# Patient Record
Sex: Female | Born: 1935 | Hispanic: No | Smoking: Never smoker
Health system: Southern US, Community
[De-identification: ages and names within clinical notes are randomized; demographics above are authoritative.]

## PROBLEM LIST (undated history)

## (undated) DIAGNOSIS — R269 Unspecified abnormalities of gait and mobility: Secondary | ICD-10-CM

## (undated) DIAGNOSIS — E785 Hyperlipidemia, unspecified: Secondary | ICD-10-CM

## (undated) DIAGNOSIS — E119 Type 2 diabetes mellitus without complications: Secondary | ICD-10-CM

## (undated) DIAGNOSIS — E079 Disorder of thyroid, unspecified: Secondary | ICD-10-CM

## (undated) DIAGNOSIS — M1990 Unspecified osteoarthritis, unspecified site: Secondary | ICD-10-CM

## (undated) DIAGNOSIS — F0280 Dementia in other diseases classified elsewhere without behavioral disturbance: Secondary | ICD-10-CM

## (undated) DIAGNOSIS — F329 Major depressive disorder, single episode, unspecified: Secondary | ICD-10-CM

## (undated) DIAGNOSIS — G309 Alzheimer's disease, unspecified: Secondary | ICD-10-CM

## (undated) DIAGNOSIS — F32A Depression, unspecified: Secondary | ICD-10-CM

## (undated) HISTORY — PX: CHOLECYSTECTOMY: SHX55

---

## 2000-05-09 ENCOUNTER — Encounter: Admission: RE | Admit: 2000-05-09 | Discharge: 2000-05-09 | Payer: Self-pay | Admitting: *Deleted

## 2000-07-19 ENCOUNTER — Other Ambulatory Visit: Admission: RE | Admit: 2000-07-19 | Discharge: 2000-07-19 | Payer: Self-pay | Admitting: *Deleted

## 2000-09-15 ENCOUNTER — Ambulatory Visit (HOSPITAL_COMMUNITY): Admission: RE | Admit: 2000-09-15 | Discharge: 2000-09-15 | Payer: Self-pay | Admitting: *Deleted

## 2001-01-23 ENCOUNTER — Encounter: Admission: RE | Admit: 2001-01-23 | Discharge: 2001-01-23 | Payer: Self-pay | Admitting: *Deleted

## 2001-05-11 ENCOUNTER — Encounter: Admission: RE | Admit: 2001-05-11 | Discharge: 2001-05-11 | Payer: Self-pay | Admitting: *Deleted

## 2002-05-15 ENCOUNTER — Encounter: Payer: Self-pay | Admitting: Internal Medicine

## 2002-05-15 ENCOUNTER — Encounter: Admission: RE | Admit: 2002-05-15 | Discharge: 2002-05-15 | Payer: Self-pay | Admitting: Internal Medicine

## 2003-05-22 ENCOUNTER — Encounter: Payer: Self-pay | Admitting: Internal Medicine

## 2003-05-22 ENCOUNTER — Encounter: Admission: RE | Admit: 2003-05-22 | Discharge: 2003-05-22 | Payer: Self-pay | Admitting: Internal Medicine

## 2004-06-09 ENCOUNTER — Encounter: Admission: RE | Admit: 2004-06-09 | Discharge: 2004-06-09 | Payer: Self-pay | Admitting: Internal Medicine

## 2005-06-17 ENCOUNTER — Encounter: Admission: RE | Admit: 2005-06-17 | Discharge: 2005-06-17 | Payer: Self-pay | Admitting: Internal Medicine

## 2006-06-20 ENCOUNTER — Encounter: Admission: RE | Admit: 2006-06-20 | Discharge: 2006-06-20 | Payer: Self-pay | Admitting: Internal Medicine

## 2007-06-25 ENCOUNTER — Emergency Department (HOSPITAL_COMMUNITY): Admission: EM | Admit: 2007-06-25 | Discharge: 2007-06-25 | Payer: Self-pay | Admitting: Emergency Medicine

## 2011-02-26 NOTE — Op Note (Signed)
[place]. [facility]  Patient:    Cassidy Paul, Cassidy Paul                        MRN: [id number] Proc. Date: 09/15/00 Adm. Date:  [id number] Attending:  Sabino Gasser                           Operative Report  PROCEDURE:  Colonoscopy.  INDICATIONS:  Cancer screening.  ANESTHESIA:  Demerol 40 mg, Versed 4 mg.  DESCRIPTION OF PROCEDURE:  With the patient mildly sedated in the left lateral decubitus position, the Olympus video colonoscope was inserted in the rectum and passed under direct vision to the cecum.  The cecum identified by ileocecal valve and appendiceal orifice both of which were photographed.  From this point the colonoscope was slowly withdrawn taking circumferential views of the entire colonic mucosa stopping in the rectum which appeared normal on direct and showed internal hemorrhoids on retroflexed view.  The endoscope was straightened and withdrawn.  The patients vital signs and pulse oximeter remained stable.  The patient tolerated the procedure well with no apparent complications.  FINDINGS:  Internal hemorrhoids, otherwise unremarkable colonoscopic examination to the cecum.  PLAN:  Repeat examination in approximately 5-10 years. DD:  09/15/00 TD:  09/15/00 Job: [id number]

## 2011-07-23 LAB — URINALYSIS, ROUTINE W REFLEX MICROSCOPIC
Bilirubin Urine: NEGATIVE
Glucose, UA: NEGATIVE
Hgb urine dipstick: NEGATIVE
Ketones, ur: NEGATIVE
Nitrite: NEGATIVE
Protein, ur: NEGATIVE
Specific Gravity, Urine: 1.021
Urobilinogen, UA: 0.2
pH: 5.5

## 2012-08-13 ENCOUNTER — Encounter (HOSPITAL_COMMUNITY): Payer: Self-pay | Admitting: Emergency Medicine

## 2012-08-13 ENCOUNTER — Emergency Department (HOSPITAL_COMMUNITY)
Admission: EM | Admit: 2012-08-13 | Discharge: 2012-08-13 | Disposition: A | Discharge status: Home / Self Care | Payer: Medicare Other | Attending: Emergency Medicine | Admitting: Emergency Medicine

## 2012-08-13 ENCOUNTER — Emergency Department (HOSPITAL_COMMUNITY): Payer: Medicare Other

## 2012-08-13 DIAGNOSIS — M129 Arthropathy, unspecified: Secondary | ICD-10-CM | POA: Insufficient documentation

## 2012-08-13 DIAGNOSIS — E119 Type 2 diabetes mellitus without complications: Secondary | ICD-10-CM | POA: Insufficient documentation

## 2012-08-13 DIAGNOSIS — IMO0002 Reserved for concepts with insufficient information to code with codable children: Secondary | ICD-10-CM | POA: Insufficient documentation

## 2012-08-13 DIAGNOSIS — E079 Disorder of thyroid, unspecified: Secondary | ICD-10-CM | POA: Insufficient documentation

## 2012-08-13 DIAGNOSIS — Z23 Encounter for immunization: Secondary | ICD-10-CM | POA: Insufficient documentation

## 2012-08-13 DIAGNOSIS — M545 Low back pain, unspecified: Secondary | ICD-10-CM | POA: Insufficient documentation

## 2012-08-13 DIAGNOSIS — Y9210 Unspecified residential institution as the place of occurrence of the external cause: Secondary | ICD-10-CM | POA: Insufficient documentation

## 2012-08-13 DIAGNOSIS — Y9389 Activity, other specified: Secondary | ICD-10-CM | POA: Insufficient documentation

## 2012-08-13 DIAGNOSIS — S50819A Abrasion of unspecified forearm, initial encounter: Secondary | ICD-10-CM

## 2012-08-13 DIAGNOSIS — W010XXA Fall on same level from slipping, tripping and stumbling without subsequent striking against object, initial encounter: Secondary | ICD-10-CM | POA: Insufficient documentation

## 2012-08-13 DIAGNOSIS — W19XXXA Unspecified fall, initial encounter: Secondary | ICD-10-CM

## 2012-08-13 DIAGNOSIS — E785 Hyperlipidemia, unspecified: Secondary | ICD-10-CM | POA: Insufficient documentation

## 2012-08-13 HISTORY — DX: Hyperlipidemia, unspecified: E78.5

## 2012-08-13 HISTORY — DX: Unspecified osteoarthritis, unspecified site: M19.90

## 2012-08-13 HISTORY — DX: Disorder of thyroid, unspecified: E07.9

## 2012-08-13 HISTORY — DX: Type 2 diabetes mellitus without complications: E11.9

## 2012-08-13 MED ORDER — TETANUS-DIPHTH-ACELL PERTUSSIS 5-2.5-18.5 LF-MCG/0.5 IM SUSP
0.5000 mL | Freq: Once | INTRAMUSCULAR | Status: AC
  Administered 2012-08-13: 0.5 mL via INTRAMUSCULAR
  Filled 2012-08-13: qty 0.5

## 2012-08-13 MED ORDER — ACETAMINOPHEN 325 MG PO TABS
650.0000 mg | ORAL_TABLET | Freq: Once | ORAL | Status: AC
  Administered 2012-08-13: 650 mg via ORAL
  Filled 2012-08-13: qty 2

## 2012-08-13 NOTE — ED Notes (Signed)
Pt states that she really doesn't remember what happened.

## 2012-08-13 NOTE — ED Provider Notes (Signed)
History     CSN: [id number]  Arrival date & time 08/13/12  0600   First MD Initiated Contact with Patient 08/13/12 0701      Chief Complaint  Patient presents with  . Fall    (Consider location/radiation/quality/duration/timing/severity/associated sxs/prior treatment) Patient is a 78 y.o. female presenting with fall. The history is provided by the patient and a relative.  Fall Pertinent negatives include no fever, no abdominal pain and no headaches.  s/p fall at [facility] this morning. Pt normally walks w walker. Had tried to go to bathroom without her walker. Tripped and fell. Denies loc. Denies preceding faintness or lightheadedness. Was able to call for staff who found pt awake, alert, mental status c/w baseline. Pt c/o low back pain. Constant. Dull. Non radiating. No numbness/weakness. No radicular pain. Also w superficial abrasion to left forearm. Tetanus unknown. No neck pain. Denies headache. No nv. No cp or sob. No abd pain. No nvd. No gu c/o. Denies other pain or injury.   Past Medical History  Diagnosis Date  . Arthritis   . Thyroid disease   . Hyperlipemia   . Diabetes mellitus without complication     Past Surgical History  Procedure Date  . Cholecystectomy     No family history on file.  History  Substance Use Topics  . Smoking status: Never Smoker   . Smokeless tobacco: Never Used  . Alcohol Use: No    OB History    Grav Para Term Preterm Abortions TAB SAB Ect Mult Living                  Review of Systems  Constitutional: Negative for fever and chills.  HENT: Negative for neck pain.   Eyes: Negative for redness.  Respiratory: Negative for shortness of breath.   Cardiovascular: Negative for chest pain.  Gastrointestinal: Negative for abdominal pain.  Genitourinary: Negative for flank pain.  Musculoskeletal: Positive for back pain.  Skin: Negative for rash.  Neurological: Negative for headaches.  Hematological: Does not bruise/bleed easily.    Psychiatric/Behavioral: Negative for confusion.    Allergies  Review of patient's allergies indicates no known allergies.  Home Medications   Current Outpatient Rx  Name  Route  Sig  Dispense  Refill  . ACETAMINOPHEN ER 650 MG PO TBCR   Oral   Take 650 mg by mouth every 8 (eight) hours as needed. ARTHRITIS PAIN         . VITAMIN D 1000 UNITS PO TABS   Oral   Take 1,000 Units by mouth daily.         . DONEPEZIL HCL 10 MG PO TABS   Oral   Take 10 mg by mouth at bedtime.         . FUROSEMIDE 40 MG PO TABS   Oral   Take 40 mg by mouth 2 (two) times daily.         Marland Kitchen LEVOTHYROXINE SODIUM 50 MCG PO TABS   Oral   Take 50 mcg by mouth daily.         Marland Kitchen PIOGLITAZONE HCL-METFORMIN HCL 15-500 MG PO TABS   Oral   Take 1 tablet by mouth 2 (two) times daily with a meal.         . POTASSIUM CHLORIDE ER 10 MEQ PO TBCR   Oral   Take 20 mEq by mouth daily.         Marland Kitchen SIMVASTATIN 40 MG PO TABS   Oral   Take  40 mg by mouth at bedtime.           BP 125/51  Pulse 75  Temp 98.6 F (37 C) (Oral)  Resp 18  SpO2 91%  Physical Exam  Nursing note and vitals reviewed. Constitutional: She appears well-developed and well-nourished. No distress.  HENT:  Head: Atraumatic.  Mouth/Throat: Oropharynx is clear and moist.  Eyes: Conjunctivae normal are normal. Pupils are equal, round, and reactive to light. No scleral icterus.  Neck: Neck supple. No tracheal deviation present.       No bruit  Cardiovascular: Normal rate, regular rhythm, normal heart sounds and intact distal pulses.   Pulmonary/Chest: Effort normal and breath sounds normal. No respiratory distress.  Abdominal: Soft. Normal appearance and bowel sounds are normal. She exhibits no distension. There is no tenderness.  Musculoskeletal: She exhibits no edema.       Small superficial abrasion to left forearm, no bony tenderness to area. Upper lumbar tenderness, otherwise CTLS spine, non tender, aligned, no step  off. Good rom bil extremities without pain or focal bony tenderness.   Neurological: She is alert.       Alert, speech clear/fluent. Answers appropriately. Mental status c/w baseline per family. Motor intact bil.   Skin: Skin is warm and dry. No rash noted.  Psychiatric: She has a normal mood and affect.    ED Course  Procedures (including critical care time)  Dg Lumbar Spine Complete  08/13/2012  *RADIOLOGY REPORT*  Clinical Data: History of fall complaining of back pain.  LUMBAR SPINE - COMPLETE 4+ VIEW  Comparison: Lumbar spine radiograph 06/25/2007.  Findings: Multiple views of the lumbar spine demonstrate no acute displaced fractures or definite compression type fractures. Alignment is anatomic.  Multilevel degenerative disc disease in lumbar facet arthropathy is noted, most severe at L5-S1. Atherosclerosis.  IMPRESSION: 1.  No acute radiographic abnormality of the lumbar spine. 2.  Mild multilevel degenerative disc disease and lumbar spondylosis. 3.  Atherosclerosis.   Original Report Authenticated By: Trudie Reed, M.D.       MDM  Xray. Tylenol po. Abrasion cleaned/sterile dressing. Tetanus im.   Reviewed nursing notes and prior charts for additional history.   Recheck pt comfortable.         Suzi Roots, MD 08/13/12 [phone]

## 2012-08-13 NOTE — ED Notes (Signed)
Bed:WA19<BR> Expected date:<BR> Expected time:<BR> Means of arrival:<BR> Comments:<BR> EMS

## 2012-08-13 NOTE — ED Notes (Addendum)
Per EMS, pt was at [facility] when she stumbled and fell to the ground. Pt hit her head but does not have any pain. Only c/o lower back tenderness. No LOC, found immediately by staff. Pt not on LSB and no c-collar in place. Pt moves all extremities well.

## 2013-06-18 ENCOUNTER — Emergency Department (HOSPITAL_COMMUNITY)
Admission: EM | Admit: 2013-06-18 | Discharge: 2013-06-18 | Disposition: A | Discharge status: Home / Self Care | Payer: Medicare Other | Attending: Emergency Medicine | Admitting: Emergency Medicine

## 2013-06-18 ENCOUNTER — Encounter (HOSPITAL_COMMUNITY): Payer: Self-pay | Admitting: Emergency Medicine

## 2013-06-18 ENCOUNTER — Emergency Department (HOSPITAL_COMMUNITY): Payer: Medicare Other

## 2013-06-18 DIAGNOSIS — Z79899 Other long term (current) drug therapy: Secondary | ICD-10-CM | POA: Insufficient documentation

## 2013-06-18 DIAGNOSIS — Y9301 Activity, walking, marching and hiking: Secondary | ICD-10-CM | POA: Insufficient documentation

## 2013-06-18 DIAGNOSIS — M129 Arthropathy, unspecified: Secondary | ICD-10-CM | POA: Insufficient documentation

## 2013-06-18 DIAGNOSIS — M549 Dorsalgia, unspecified: Secondary | ICD-10-CM

## 2013-06-18 DIAGNOSIS — E785 Hyperlipidemia, unspecified: Secondary | ICD-10-CM | POA: Insufficient documentation

## 2013-06-18 DIAGNOSIS — W010XXA Fall on same level from slipping, tripping and stumbling without subsequent striking against object, initial encounter: Secondary | ICD-10-CM | POA: Insufficient documentation

## 2013-06-18 DIAGNOSIS — IMO0002 Reserved for concepts with insufficient information to code with codable children: Secondary | ICD-10-CM | POA: Insufficient documentation

## 2013-06-18 DIAGNOSIS — Y9210 Unspecified residential institution as the place of occurrence of the external cause: Secondary | ICD-10-CM | POA: Insufficient documentation

## 2013-06-18 DIAGNOSIS — E079 Disorder of thyroid, unspecified: Secondary | ICD-10-CM | POA: Insufficient documentation

## 2013-06-18 DIAGNOSIS — E119 Type 2 diabetes mellitus without complications: Secondary | ICD-10-CM | POA: Insufficient documentation

## 2013-06-18 NOTE — ED Notes (Signed)
CBG 140  

## 2013-06-18 NOTE — ED Notes (Signed)
[facility] called  

## 2013-06-18 NOTE — ED Notes (Signed)
Bed: WA10 Expected date:  Expected time:  Means of arrival:  Comments: EMS Fall 

## 2013-06-18 NOTE — ED Notes (Signed)
Per EMS-pt from [facility] with c/o of fall this am. Pain to lower back. Staff states that pt hit head. Pt denies pain to head. Hx of Alzheimer's.

## 2013-06-18 NOTE — ED Provider Notes (Signed)
CSN: [id number]     Arrival date & time 06/18/13  0847 History   First MD Initiated Contact with Patient 06/18/13 [id number]     Chief Complaint  Patient presents with  . Fall    HPI  Is a very pleasant 79 year old female from [place], a care facility. She fell this morning. She was walking to breakfast. She states she caught her toe. She lost her balance. She caught herself against the wall with her hands but then fell backwards. She states she may have bumped her head. She hasn't had a headache or any pain. She has some pain in her back. She was able to get herself up and ambulate. However it was witnessed by staff and she was referred here for evaluation.. No headache. No neck pain. Additionally symptoms. She is quite clear that this was a fall had no prodromal symptoms with dizziness or palpitations or pain.. She states "they will make a federal case out of it, I just want to go back and have breakfast" . Past Medical History  Diagnosis Date  . Arthritis   . Thyroid disease   . Hyperlipemia   . Diabetes mellitus without complication    Past Surgical History  Procedure Laterality Date  . Cholecystectomy     History reviewed. No pertinent family history. History  Substance Use Topics  . Smoking status: Never Smoker   . Smokeless tobacco: Never Used  . Alcohol Use: No   OB History   Grav Para Term Preterm Abortions TAB SAB Ect Mult Living                 Review of Systems  Constitutional: Negative for fever, chills, diaphoresis, appetite change and fatigue.  HENT: Negative for sore throat, mouth sores and trouble swallowing.   Eyes: Negative for visual disturbance.  Respiratory: Negative for cough, chest tightness, shortness of breath and wheezing.   Cardiovascular: Negative for chest pain.  Gastrointestinal: Negative for nausea, vomiting, abdominal pain, diarrhea and abdominal distention.  Endocrine: Negative for polydipsia, polyphagia and polyuria.  Genitourinary: Negative for  dysuria, frequency and hematuria.  Musculoskeletal: Positive for back pain. Negative for gait problem.  Skin: Negative for color change, pallor and rash.  Neurological: Negative for dizziness, syncope, weakness, light-headedness, numbness and headaches.  Hematological: Does not bruise/bleed easily.  Psychiatric/Behavioral: Negative for behavioral problems and confusion.    Allergies  Review of patient's allergies indicates no known allergies.  Home Medications   Current Outpatient Rx  Name  Route  Sig  Dispense  Refill  . acetaminophen (TYLENOL) 650 MG CR tablet   Oral   Take 650 mg by mouth every 8 (eight) hours as needed for pain.          . cholecalciferol (VITAMIN D) 1000 UNITS tablet   Oral   Take 1,000 Units by mouth daily.         Marland Kitchen donepezil (ARICEPT) 10 MG tablet   Oral   Take 10 mg by mouth at bedtime.         . furosemide (LASIX) 40 MG tablet   Oral   Take 40 mg by mouth 2 (two) times daily.         Marland Kitchen glipiZIDE (GLUCOTROL) 5 MG tablet   Oral   Take 5 mg by mouth 2 (two) times daily before a meal.         . levothyroxine (SYNTHROID, LEVOTHROID) 50 MCG tablet   Oral   Take 50 mcg by mouth daily.         Marland Kitchen  potassium chloride (K-DUR) 10 MEQ tablet   Oral   Take 20 mEq by mouth daily.         . simvastatin (ZOCOR) 40 MG tablet   Oral   Take 40 mg by mouth at bedtime.          BP 174/45  Pulse 76  Temp(Src) 98.8 F (37.1 C) (Oral)  Resp 18  SpO2 97% Physical Exam  Constitutional:  This is awake alert oriented lucid 79 year old female.  HENT:  Senna Combivent. Nontender over the scalp. No erythema or soft tissue swelling over the scalp  Musculoskeletal:  Physical exam her back she has some discomfort diffusely in the paraspinal musculature lumbar spine. Nontender over the iliac crest and the trochanters or the hips. Normal range of motion lower extremities without pain.  Neurological:  Awake alert. Oriented lucid. The recent  extremities. Normal cranial nerves.    ED Course  Procedures (including critical care time) Labs Review Labs Reviewed - No data to display Imaging Review Dg Lumbar Spine Complete  06/18/2013   *RADIOLOGY REPORT*  Clinical Data: Recent injury with low back pain  LUMBAR SPINE - COMPLETE 4+ VIEW  Comparison: 08/13/2012  Findings: Five lumbar type vertebral bodies are well visualized. Facet hypertrophic changes are seen.  No spondylolysis or spondylolisthesis is noted.  A compression deformity of L1 is seen which is new from prior exam but appears chronic in nature with increased sclerosis. No other focal abnormality is noted.  IMPRESSION: Compression deformity of L1 which appears chronic in nature.  It is of uncertain age however.  An MRI or bone scan would be helpful to assess for the acuteness of this fracture.   Original Report Authenticated By: Alcide Clever, M.D.    MDM   1. Back pain    X-ray showed old stable appearing compression fracture. No acute abnormalities noted. She is mandatory the department. Plan is to her care facility her diagnosis is back pain after fall, without acute neurologic abnormality.    Claudean Kinds, MD 06/18/13 1046

## 2013-07-27 ENCOUNTER — Emergency Department (HOSPITAL_COMMUNITY): Payer: Medicare Other

## 2013-07-27 ENCOUNTER — Encounter (HOSPITAL_COMMUNITY): Payer: Self-pay | Admitting: Emergency Medicine

## 2013-07-27 ENCOUNTER — Emergency Department (HOSPITAL_COMMUNITY)
Admission: EM | Admit: 2013-07-27 | Discharge: 2013-07-27 | Disposition: A | Discharge status: Nursing Home (Includes ICF) | Payer: Medicare Other | Attending: Emergency Medicine | Admitting: Emergency Medicine

## 2013-07-27 DIAGNOSIS — W19XXXA Unspecified fall, initial encounter: Secondary | ICD-10-CM

## 2013-07-27 DIAGNOSIS — Z043 Encounter for examination and observation following other accident: Secondary | ICD-10-CM | POA: Insufficient documentation

## 2013-07-27 DIAGNOSIS — Z79899 Other long term (current) drug therapy: Secondary | ICD-10-CM | POA: Insufficient documentation

## 2013-07-27 DIAGNOSIS — M129 Arthropathy, unspecified: Secondary | ICD-10-CM | POA: Insufficient documentation

## 2013-07-27 DIAGNOSIS — F0280 Dementia in other diseases classified elsewhere without behavioral disturbance: Secondary | ICD-10-CM | POA: Insufficient documentation

## 2013-07-27 DIAGNOSIS — E785 Hyperlipidemia, unspecified: Secondary | ICD-10-CM | POA: Insufficient documentation

## 2013-07-27 DIAGNOSIS — Y9389 Activity, other specified: Secondary | ICD-10-CM | POA: Insufficient documentation

## 2013-07-27 DIAGNOSIS — Y9210 Unspecified residential institution as the place of occurrence of the external cause: Secondary | ICD-10-CM | POA: Insufficient documentation

## 2013-07-27 DIAGNOSIS — R296 Repeated falls: Secondary | ICD-10-CM | POA: Insufficient documentation

## 2013-07-27 DIAGNOSIS — E119 Type 2 diabetes mellitus without complications: Secondary | ICD-10-CM | POA: Insufficient documentation

## 2013-07-27 DIAGNOSIS — I498 Other specified cardiac arrhythmias: Secondary | ICD-10-CM | POA: Insufficient documentation

## 2013-07-27 DIAGNOSIS — G309 Alzheimer's disease, unspecified: Secondary | ICD-10-CM | POA: Insufficient documentation

## 2013-07-27 DIAGNOSIS — E079 Disorder of thyroid, unspecified: Secondary | ICD-10-CM | POA: Insufficient documentation

## 2013-07-27 LAB — BASIC METABOLIC PANEL
Calcium: 9.1 mg/dL (ref 8.4–10.5)
Chloride: 100 mEq/L (ref 96–112)
Creatinine, Ser: 1 mg/dL (ref 0.50–1.10)
GFR calc Af Amer: 56 mL/min — ABNORMAL LOW (ref >90)
Sodium: 140 mEq/L (ref 135–145)

## 2013-07-27 LAB — CBC
MCV: 84.7 fL (ref 78.0–100.0)
Platelets: 178 10*3/uL (ref 150–400)
RBC: 4.76 MIL/uL (ref 3.87–5.11)
RDW: 14.6 % (ref 11.5–15.5)
WBC: 4.4 10*3/uL (ref 4.0–10.5)

## 2013-07-27 NOTE — ED Provider Notes (Signed)
CSN: [id number]     Arrival date & time 07/27/13  1236 History   First MD Initiated Contact with Patient 07/27/13 1236     Chief Complaint  Patient presents with  . Fall   (Consider location/radiation/quality/duration/timing/severity/associated sxs/prior Treatment) Patient is a 79 y.o. female presenting with fall. The history is provided by the patient and the EMS personnel.  Fall This is a recurrent problem. The current episode started 1 to 2 hours ago. Episode frequency: once. The problem has been resolved. Pertinent negatives include no chest pain, no abdominal pain, no headaches and no shortness of breath. Nothing aggravates the symptoms. Nothing relieves the symptoms. She has tried nothing for the symptoms.    Past Medical History  Diagnosis Date  . Arthritis   . Thyroid disease   . Hyperlipemia   . Diabetes mellitus without complication    Past Surgical History  Procedure Laterality Date  . Cholecystectomy     No family history on file. History  Substance Use Topics  . Smoking status: Never Smoker   . Smokeless tobacco: Never Used  . Alcohol Use: No   OB History   Grav Para Term Preterm Abortions TAB SAB Ect Mult Living                 Review of Systems  Constitutional: Negative for fever.  Respiratory: Negative for cough and shortness of breath.   Cardiovascular: Negative for chest pain.  Gastrointestinal: Negative for vomiting and abdominal pain.  Neurological: Negative for headaches.  All other systems reviewed and are negative.    Allergies  Review of patient's allergies indicates no known allergies.  Home Medications   Current Outpatient Rx  Name  Route  Sig  Dispense  Refill  . cholecalciferol (VITAMIN D) 400 UNITS TABS tablet   Oral   Take 800 Units by mouth daily.         Marland Kitchen donepezil (ARICEPT) 10 MG tablet   Oral   Take 10 mg by mouth at bedtime.         . furosemide (LASIX) 40 MG tablet   Oral   Take 40 mg by mouth 2 (two) times  daily.         Marland Kitchen glipiZIDE (GLUCOTROL) 5 MG tablet   Oral   Take 5 mg by mouth 2 (two) times daily before a meal.         . levothyroxine (SYNTHROID, LEVOTHROID) 50 MCG tablet   Oral   Take 50 mcg by mouth daily before breakfast.          . lisinopril (PRINIVIL,ZESTRIL) 2.5 MG tablet   Oral   Take 1.25 mg by mouth daily.         . potassium chloride (K-DUR) 10 MEQ tablet   Oral   Take 20 mEq by mouth daily.         . simvastatin (ZOCOR) 10 MG tablet   Oral   Take 10 mg by mouth at bedtime.         Marland Kitchen acetaminophen (TYLENOL) 650 MG CR tablet   Oral   Take 650 mg by mouth every 6 (six) hours as needed for pain.           BP 128/74  Pulse 68  Temp(Src) 98.7 F (37.1 C) (Oral)  Resp 18  SpO2 96% Physical Exam  Nursing note and vitals reviewed. Constitutional: She appears well-developed and well-nourished. No distress.  HENT:  Head: Normocephalic and atraumatic.  Eyes: EOM are normal.  Pupils are equal, round, and reactive to light.  Neck: Normal range of motion. Neck supple.  Cardiovascular: Normal rate and regular rhythm.  Exam reveals no friction rub.   No murmur heard. Pulmonary/Chest: Effort normal and breath sounds normal. No respiratory distress. She has no wheezes. She has no rales.  Abdominal: Soft. She exhibits no distension. There is no tenderness. There is no rebound.  Musculoskeletal: Normal range of motion. She exhibits no edema.  No bony deformity. No spinal tenderness, no spinal deformity.  Neurological: She is alert. No cranial nerve deficit. She exhibits normal muscle tone. Coordination normal.  Skin: She is not diaphoretic.    ED Course  Procedures (including critical care time) Labs Review Labs Reviewed  BASIC METABOLIC PANEL - Abnormal; Notable for the following:    Glucose, Bld 106 (*)    BUN 25 (*)    GFR calc non Af Amer 48 (*)    GFR calc Af Amer 56 (*)    All other components within normal limits  CBC   Imaging Review Dg  Chest 2 View  07/27/2013   CLINICAL DATA:  Pain after fall.  EXAM: CHEST  2 VIEW  COMPARISON:  None.  FINDINGS: Mild cardiomegaly is noted. Scarring is noted in both lungs. No pneumothorax or pleural effusion is noted. No acute pulmonary disease is noted. Bony thorax appears intact.  IMPRESSION: No acute cardiopulmonary abnormality seen.   Electronically Signed   By: Roque Lias M.D.   On: 07/27/2013 14:06   Dg Thoracic Spine 4v  07/27/2013   CLINICAL DATA:  Mid back pain after fall.  EXAM: THORACIC SPINE - 4+ VIEW  COMPARISON:  June 18, 2013.  FINDINGS: Old compression fracture of L1 vertebral body is noted which is unchanged compared to prior exam. No acute fracture or spondylolisthesis is noted. Mild levoscoliosis of upper thoracic spine is noted.  IMPRESSION: No acute abnormality seen in the thoracic spine. Old L1 compression fracture is again noted.   Electronically Signed   By: Roque Lias M.D.   On: 07/27/2013 14:09   Dg Pelvis 1-2 Views  07/27/2013   CLINICAL DATA:  Pain after fall.  EXAM: PELVIS - 1-2 VIEW  COMPARISON:  None.  FINDINGS: There is no evidence of pelvic fracture or diastasis. No other pelvic bone lesions are seen.  IMPRESSION: Normal pelvis.   Electronically Signed   By: Roque Lias M.D.   On: 07/27/2013 14:07   Ct Head Wo Contrast  07/27/2013   CLINICAL DATA:  Fall  EXAM: CT HEAD WITHOUT CONTRAST  TECHNIQUE: Contiguous axial images were obtained from the base of the skull through the vertex without intravenous contrast.  COMPARISON:  None.  FINDINGS: No mass lesion. No midline shift. No acute hemorrhage or hematoma. No extra-axial fluid collections. No evidence of acute infarction. Cerebral atrophy is noted. There are periventricular and deep white matter microvascular ischemic changes Calvarium is intact. The visualized paranasal sinuses and mastoid air cells are clear. Orbital soft tissues are unremarkable.  IMPRESSION: No acute intracranial abnormality.    Electronically Signed   By: Jerene Dilling M.D.   On: 07/27/2013 14:44    EKG Interpretation     Ventricular Rate:  57 PR Interval:  149 QRS Duration: 72 QT Interval:  439 QTC Calculation: 428 R Axis:   66 Text Interpretation:  Sinus rhythm Atrial premature complex Probable anteroseptal infarct, old Q waves in anterior leads, changed from prior  MDM   1. Fall, initial encounter    79 year old female with history of Alzheimer's disease, arthritis, diabetes presents with a fall. She was in the lobby and felt her left. She did not have any injury, she did not lose consciousness. She fell to her left side. She is able to get up and denied any pain or on EMS arrival. She does have a history of dementia. On exam here, she is afebrile. She is bradycardic with sinus bradycardia in the 40s and 50s exam. She has normal blood pressure and normal oxygen saturation on room air with good waveform. She has no bony deformities. She has no spinal tenderness, no arm pain, no leg pain. She has normal range of motion of her hips. She is stable pelvis. She does her lungs. She has no signs of trauma to her head. She did initially state she had mid back pain, so we will x-ray her T. spine. Also screen with x-ray of her chest, pelvis. Also CT her head since she did fall. Studies normal. Stable for discharge.   Dagmar Hait, MD 07/27/13 2028

## 2013-07-27 NOTE — ED Notes (Signed)
Ptar recalled 

## 2013-07-27 NOTE — ED Notes (Signed)
Per EMS pt came from [facility] where she fell from standing. No obvious injury, no LOC, no new pain. Pt fell to left side. No immobilization due to not having pain. Pt AAO to baseline - has hx of dementia and alzheimer's.

## 2013-07-27 NOTE — ED Notes (Signed)
[facility] called for transport.  

## 2014-01-09 DEATH — deceased

## 2014-11-27 ENCOUNTER — Emergency Department (HOSPITAL_COMMUNITY)
Admission: EM | Admit: 2014-11-27 | Discharge: 2014-11-27 | Disposition: A | Discharge status: Home / Self Care | Payer: Medicare Other | Attending: Emergency Medicine | Admitting: Emergency Medicine

## 2014-11-27 ENCOUNTER — Encounter (HOSPITAL_COMMUNITY): Payer: Self-pay

## 2014-11-27 DIAGNOSIS — W19XXXA Unspecified fall, initial encounter: Secondary | ICD-10-CM

## 2014-11-27 DIAGNOSIS — G309 Alzheimer's disease, unspecified: Secondary | ICD-10-CM | POA: Insufficient documentation

## 2014-11-27 DIAGNOSIS — Y9389 Activity, other specified: Secondary | ICD-10-CM | POA: Diagnosis not present

## 2014-11-27 DIAGNOSIS — Y92128 Other place in nursing home as the place of occurrence of the external cause: Secondary | ICD-10-CM | POA: Insufficient documentation

## 2014-11-27 DIAGNOSIS — E079 Disorder of thyroid, unspecified: Secondary | ICD-10-CM | POA: Diagnosis not present

## 2014-11-27 DIAGNOSIS — E785 Hyperlipidemia, unspecified: Secondary | ICD-10-CM | POA: Diagnosis not present

## 2014-11-27 DIAGNOSIS — W010XXA Fall on same level from slipping, tripping and stumbling without subsequent striking against object, initial encounter: Secondary | ICD-10-CM | POA: Insufficient documentation

## 2014-11-27 DIAGNOSIS — F0280 Dementia in other diseases classified elsewhere without behavioral disturbance: Secondary | ICD-10-CM | POA: Insufficient documentation

## 2014-11-27 DIAGNOSIS — Z79899 Other long term (current) drug therapy: Secondary | ICD-10-CM | POA: Diagnosis not present

## 2014-11-27 DIAGNOSIS — Z043 Encounter for examination and observation following other accident: Secondary | ICD-10-CM | POA: Insufficient documentation

## 2014-11-27 DIAGNOSIS — Y998 Other external cause status: Secondary | ICD-10-CM | POA: Diagnosis not present

## 2014-11-27 DIAGNOSIS — M1990 Unspecified osteoarthritis, unspecified site: Secondary | ICD-10-CM | POA: Diagnosis not present

## 2014-11-27 DIAGNOSIS — E119 Type 2 diabetes mellitus without complications: Secondary | ICD-10-CM | POA: Diagnosis not present

## 2014-11-27 DIAGNOSIS — R269 Unspecified abnormalities of gait and mobility: Secondary | ICD-10-CM | POA: Insufficient documentation

## 2014-11-27 HISTORY — DX: Dementia in other diseases classified elsewhere, unspecified severity, without behavioral disturbance, psychotic disturbance, mood disturbance, and anxiety: F02.80

## 2014-11-27 HISTORY — DX: Alzheimer's disease, unspecified: G30.9

## 2014-11-27 HISTORY — DX: Unspecified abnormalities of gait and mobility: R26.9

## 2014-11-27 NOTE — ED Notes (Signed)
[facility] with staff there discovering her on the floor, having fallen, she states on her "bottom".  She states she did strike her head, but that now it does not jurt and she really has no complaints at the moment.  She is awake, alert and in no distress.

## 2014-11-27 NOTE — ED Provider Notes (Signed)
CSN: [id number]     Arrival date & time 11/27/14  0909 History   First MD Initiated Contact with Patient 11/27/14 0912     Chief Complaint  Patient presents with  . Fall     Level V caveat: Dementia  HPI Patient brought to the emergency department from the nursing facility where she was found on the ground.  Patient is without complaints at this time.  Baseline mental status per staff.  Patient has a history of dementia.  Patient denies pain with range of motion or major joints.  She was able to stand at the bedside without any difficulty.  Patient states she feels fine and would like to go home.  She does report remembering the fall and states that she slipped and fell on her bottom.  She denies pain in her bottom.   Past Medical History  Diagnosis Date  . Arthritis   . Thyroid disease   . Hyperlipemia   . Diabetes mellitus without complication   . Alzheimer disease   . Abnormal gait    Past Surgical History  Procedure Laterality Date  . Cholecystectomy     No family history on file. History  Substance Use Topics  . Smoking status: Never Smoker   . Smokeless tobacco: Never Used  . Alcohol Use: No   OB History    No data available     Review of Systems  Unable to perform ROS     Allergies  Review of patient's allergies indicates no known allergies.  Home Medications   Prior to Admission medications   Medication Sig Start Date End Date Taking? Authorizing Provider  acetaminophen (TYLENOL) 650 MG CR tablet Take 650 mg by mouth every 6 (six) hours as needed for pain.    Yes Historical Provider, MD  Acetic Acid (DOUCHE VINEGAR/WATER VA) Place 1 each vaginally once a week. Mix 1 tbsp of vinegar in 1 pint of warm water and wash perineal area.   Yes Historical Provider, MD  cholecalciferol (VITAMIN D) 400 UNITS TABS tablet Take 800 Units by mouth daily.   Yes Historical Provider, MD  donepezil (ARICEPT) 10 MG tablet Take 10 mg by mouth at bedtime.   Yes Historical  Provider, MD  furosemide (LASIX) 40 MG tablet Take 20 mg by mouth 2 (two) times daily.    Yes Historical Provider, MD  glipiZIDE (GLUCOTROL) 10 MG tablet Take 10 mg by mouth 2 (two) times daily.   Yes Historical Provider, MD  Infant Foods (WATER ORAL) LIQD Take 8 mLs by mouth 4 (four) times daily. Encourage 8 oz of water.   Yes Historical Provider, MD  levothyroxine (SYNTHROID, LEVOTHROID) 50 MCG tablet Take 50 mcg by mouth daily before breakfast.    Yes Historical Provider, MD  lisinopril (PRINIVIL,ZESTRIL) 2.5 MG tablet Take 1.25 mg by mouth daily.   Yes Historical Provider, MD  metFORMIN (GLUCOPHAGE) 500 MG tablet Take 500 mg by mouth 2 (two) times daily.   Yes Historical Provider, MD  potassium chloride (K-DUR) 10 MEQ tablet Take 20 mEq by mouth daily.   Yes Historical Provider, MD  protein supplement (RESOURCE BENEPROTEIN) POWD Take 2 scoop by mouth 3 (three) times daily with meals. In 4 ounces of water.   Yes Historical Provider, MD  simvastatin (ZOCOR) 10 MG tablet Take 10 mg by mouth at bedtime.   Yes Historical Provider, MD   BP 117/61 mmHg  Pulse 61  Temp(Src) 97.9 F (36.6 C) (Oral)  Resp 20  SpO2 96%  Physical Exam  Constitutional: She appears well-developed and well-nourished. No distress.  HENT:  Head: Normocephalic and atraumatic.  Eyes: EOM are normal.  Neck: Normal range of motion. Neck supple.  C-spine nontender.  Cardiovascular: Normal rate, regular rhythm and normal heart sounds.   Pulmonary/Chest: Effort normal and breath sounds normal.  Abdominal: Soft. She exhibits no distension. There is no tenderness.  Musculoskeletal: Normal range of motion.  Full range of motion bilateral ankles, knees, hips.  Full range of motion bilateral wrists, elbows, shoulders.No thoracic, lumbar, cervical tenderness  Neurological: She is alert.  Skin: Skin is warm and dry.  Psychiatric: She has a normal mood and affect. Judgment normal.  Nursing note and vitals reviewed.   ED Course   Procedures (including critical care time) Labs Review Labs Reviewed - No data to display  Imaging Review No results found.   EKG Interpretation   Date/Time:  Wednesday November 27 2014 09:17:51 EST Ventricular Rate:  63 PR Interval:  139 QRS Duration: 75 QT Interval:  421 QTC Calculation: 431 R Axis:   54 Text Interpretation:  Sinus rhythm Atrial premature complexes in couplets  nonspecific st changes No significant change was found Confirmed by Everest Brod   MD, Caryn Bee ([id number]) on 11/27/2014 9:21:10 AM      MDM   Final diagnoses:  Fall, initial encounter   Pt is overall well-appearing.no obvious injury noted on physical examination.  Able stand in the room without any difficulty.  Discharge home with primary care follow-up.    Lyanne Co, MD 11/27/14 [phone]

## 2014-11-27 NOTE — ED Notes (Signed)
Bed: WA04 Expected date:  Expected time:  Means of arrival:  Comments: 81 y/o F fall

## 2014-11-27 NOTE — ED Notes (Signed)
Our Diplomatic Services operational officersecretary, Misty StanleyLisa phones [facility] at this time for transport.  Pt. Remains in no distress.  She is assisted to stand in the presence of Dr. Patria Maneampos; and she was able to bear wt. Without pain or discomfort.

## 2015-03-07 ENCOUNTER — Emergency Department (HOSPITAL_COMMUNITY)
Admission: EM | Admit: 2015-03-07 | Discharge: 2015-03-07 | Disposition: A | Discharge status: Home / Self Care | Payer: Medicare Other | Attending: Emergency Medicine | Admitting: Emergency Medicine

## 2015-03-07 ENCOUNTER — Encounter (HOSPITAL_COMMUNITY): Payer: Self-pay | Admitting: Emergency Medicine

## 2015-03-07 ENCOUNTER — Emergency Department (HOSPITAL_COMMUNITY): Payer: Medicare Other

## 2015-03-07 DIAGNOSIS — Z043 Encounter for examination and observation following other accident: Secondary | ICD-10-CM | POA: Diagnosis not present

## 2015-03-07 DIAGNOSIS — F0280 Dementia in other diseases classified elsewhere without behavioral disturbance: Secondary | ICD-10-CM | POA: Insufficient documentation

## 2015-03-07 DIAGNOSIS — Y998 Other external cause status: Secondary | ICD-10-CM | POA: Insufficient documentation

## 2015-03-07 DIAGNOSIS — E119 Type 2 diabetes mellitus without complications: Secondary | ICD-10-CM | POA: Diagnosis not present

## 2015-03-07 DIAGNOSIS — E785 Hyperlipidemia, unspecified: Secondary | ICD-10-CM | POA: Insufficient documentation

## 2015-03-07 DIAGNOSIS — Y9301 Activity, walking, marching and hiking: Secondary | ICD-10-CM | POA: Diagnosis not present

## 2015-03-07 DIAGNOSIS — Z8739 Personal history of other diseases of the musculoskeletal system and connective tissue: Secondary | ICD-10-CM | POA: Diagnosis not present

## 2015-03-07 DIAGNOSIS — G309 Alzheimer's disease, unspecified: Secondary | ICD-10-CM | POA: Diagnosis not present

## 2015-03-07 DIAGNOSIS — Z79899 Other long term (current) drug therapy: Secondary | ICD-10-CM | POA: Insufficient documentation

## 2015-03-07 DIAGNOSIS — W1839XA Other fall on same level, initial encounter: Secondary | ICD-10-CM | POA: Insufficient documentation

## 2015-03-07 DIAGNOSIS — W19XXXA Unspecified fall, initial encounter: Secondary | ICD-10-CM

## 2015-03-07 DIAGNOSIS — E079 Disorder of thyroid, unspecified: Secondary | ICD-10-CM | POA: Insufficient documentation

## 2015-03-07 DIAGNOSIS — Y92128 Other place in nursing home as the place of occurrence of the external cause: Secondary | ICD-10-CM | POA: Insufficient documentation

## 2015-03-07 HISTORY — DX: Major depressive disorder, single episode, unspecified: F32.9

## 2015-03-07 HISTORY — DX: Depression, unspecified: F32.A

## 2015-03-07 NOTE — ED Notes (Addendum)
Patient transported to CT 

## 2015-03-07 NOTE — Discharge Instructions (Signed)

## 2015-03-07 NOTE — ED Notes (Signed)
[facility] Expected date:  Expected time:  Means of arrival:  Comments: EMS- elderly, fall, no complaints

## 2015-03-07 NOTE — Progress Notes (Signed)
Patient from [facility] per chart review.  Patient's pcp listed from nursing facility paperwork is Dr. Florentina JennyHenry Tripp.  System updated.

## 2015-03-07 NOTE — Progress Notes (Signed)
CSW met with pt at bedside. There was no family present.  Patient is from [facility]. Per note, the pt presents to [facility] due to an unwitnessed fall.  The pt appeared to be slightly confused at bedside. Note states that the pt has a hx of Alzheimers.  Willette Brace [phone] ED CSW 03/07/2015 10:22 PM

## 2015-03-07 NOTE — ED Notes (Signed)
[facility] called for transport.  

## 2015-03-07 NOTE — ED Notes (Signed)
[facility] [phone]([id number] [facility] [address] Pt had unwitnessed fall that was heard by visitor. No LOC, pt not on blood thinners. Pt was found by EMS lying on the floor with the ends of her pants over her feet. Pt has no complaints, was sent here for evaluation. Hx dementia, oriented per norm.

## 2015-03-07 NOTE — ED Provider Notes (Signed)
CSN: [id number]     Arrival date & time 03/07/15  1818 History   First MD Initiated Contact with Patient 03/07/15 1842     Chief Complaint  Patient presents with  . Fall     (Consider location/radiation/quality/duration/timing/severity/associated sxs/prior Treatment) HPI Comments: Patient fell while walking. No LOC. Patient here relaxing, denies any pain.  Patient is a 81 y.o. female presenting with fall. The history is provided by the patient.  Fall This is a new problem. The current episode started less than 1 hour ago. Episode frequency: once. The problem has been resolved. Pertinent negatives include no chest pain and no shortness of breath. Nothing aggravates the symptoms. Nothing relieves the symptoms.    Past Medical History  Diagnosis Date  . Arthritis   . Thyroid disease   . Hyperlipemia   . Diabetes mellitus without complication   . Alzheimer disease   . Abnormal gait   . Depression    Past Surgical History  Procedure Laterality Date  . Cholecystectomy     History reviewed. No pertinent family history. History  Substance Use Topics  . Smoking status: Never Smoker   . Smokeless tobacco: Never Used  . Alcohol Use: No   OB History    No data available     Review of Systems  Unable to perform ROS: Dementia  Respiratory: Negative for shortness of breath.   Cardiovascular: Negative for chest pain.      Allergies  Review of patient's allergies indicates no known allergies.  Home Medications   Prior to Admission medications   Medication Sig Start Date End Date Taking? Authorizing Provider  acetaminophen (TYLENOL) 650 MG CR tablet Take 650 mg by mouth every 6 (six) hours as needed for pain.     Historical Provider, MD  Acetic Acid (DOUCHE VINEGAR/WATER VA) Place 1 each vaginally once a week. Mix 1 tbsp of vinegar in 1 pint of warm water and wash perineal area.    Historical Provider, MD  cholecalciferol (VITAMIN D) 400 UNITS TABS tablet Take 800 Units by  mouth daily.    Historical Provider, MD  donepezil (ARICEPT) 10 MG tablet Take 10 mg by mouth at bedtime.    Historical Provider, MD  furosemide (LASIX) 40 MG tablet Take 20 mg by mouth 2 (two) times daily.     Historical Provider, MD  glipiZIDE (GLUCOTROL) 10 MG tablet Take 10 mg by mouth 2 (two) times daily.    Historical Provider, MD  Infant Foods (WATER ORAL) LIQD Take 8 mLs by mouth 4 (four) times daily. Encourage 8 oz of water.    Historical Provider, MD  levothyroxine (SYNTHROID, LEVOTHROID) 50 MCG tablet Take 50 mcg by mouth daily before breakfast.     Historical Provider, MD  lisinopril (PRINIVIL,ZESTRIL) 2.5 MG tablet Take 1.25 mg by mouth daily.    Historical Provider, MD  metFORMIN (GLUCOPHAGE) 500 MG tablet Take 500 mg by mouth 2 (two) times daily.    Historical Provider, MD  potassium chloride (K-DUR) 10 MEQ tablet Take 20 mEq by mouth daily.    Historical Provider, MD  protein supplement (RESOURCE BENEPROTEIN) POWD Take 2 scoop by mouth 3 (three) times daily with meals. In 4 ounces of water.    Historical Provider, MD  simvastatin (ZOCOR) 10 MG tablet Take 10 mg by mouth at bedtime.    Historical Provider, MD   BP 114/48 mmHg  Pulse 86  Temp(Src) 98.4 F (36.9 C) (Oral)  Resp 16  SpO2 99% Physical Exam  Constitutional: She is oriented to person, place, and time. She appears well-developed and well-nourished. No distress.  HENT:  Head: Normocephalic and atraumatic.  Mouth/Throat: Oropharynx is clear and moist.  Eyes: EOM are normal. Pupils are equal, round, and reactive to light.  Neck: Normal range of motion. Neck supple.  Cardiovascular: Normal rate and regular rhythm.  Exam reveals no friction rub.   No murmur heard. Pulmonary/Chest: Effort normal and breath sounds normal. No respiratory distress. She has no wheezes. She has no rales.  Abdominal: Soft. She exhibits no distension. There is no tenderness. There is no rebound.  Musculoskeletal: Normal range of motion. She  exhibits no edema.  Neurological: She is alert and oriented to person, place, and time.  Skin: No rash noted. She is not diaphoretic.  Nursing note and vitals reviewed.   ED Course  Procedures (including critical care time) Labs Review Labs Reviewed - No data to display  Imaging Review No results found.   EKG Interpretation None      MDM   Final diagnoses:  Fall    47F with hx of dementia here after an unwitnessed fall. No LOC. Doing well here, denies any complaints. No long bone deformities. No signs of trauma.  Head CT ok. Stable for discharge.    Elwin MochaBlair Disha Cottam, MD 03/07/15 [phone]

## 2015-03-24 ENCOUNTER — Emergency Department (HOSPITAL_COMMUNITY)
Admission: EM | Admit: 2015-03-24 | Discharge: 2015-03-24 | Disposition: A | Discharge status: Home / Self Care | Payer: Medicare Other | Attending: Emergency Medicine | Admitting: Emergency Medicine

## 2015-03-24 ENCOUNTER — Emergency Department (HOSPITAL_COMMUNITY): Payer: Medicare Other

## 2015-03-24 DIAGNOSIS — Z79899 Other long term (current) drug therapy: Secondary | ICD-10-CM | POA: Insufficient documentation

## 2015-03-24 DIAGNOSIS — W01198A Fall on same level from slipping, tripping and stumbling with subsequent striking against other object, initial encounter: Secondary | ICD-10-CM | POA: Diagnosis not present

## 2015-03-24 DIAGNOSIS — E079 Disorder of thyroid, unspecified: Secondary | ICD-10-CM | POA: Diagnosis not present

## 2015-03-24 DIAGNOSIS — Y9301 Activity, walking, marching and hiking: Secondary | ICD-10-CM | POA: Diagnosis not present

## 2015-03-24 DIAGNOSIS — Y998 Other external cause status: Secondary | ICD-10-CM | POA: Diagnosis not present

## 2015-03-24 DIAGNOSIS — Z8739 Personal history of other diseases of the musculoskeletal system and connective tissue: Secondary | ICD-10-CM | POA: Diagnosis not present

## 2015-03-24 DIAGNOSIS — E119 Type 2 diabetes mellitus without complications: Secondary | ICD-10-CM | POA: Diagnosis not present

## 2015-03-24 DIAGNOSIS — G309 Alzheimer's disease, unspecified: Secondary | ICD-10-CM | POA: Insufficient documentation

## 2015-03-24 DIAGNOSIS — Z043 Encounter for examination and observation following other accident: Secondary | ICD-10-CM | POA: Diagnosis present

## 2015-03-24 DIAGNOSIS — Z8659 Personal history of other mental and behavioral disorders: Secondary | ICD-10-CM | POA: Diagnosis not present

## 2015-03-24 DIAGNOSIS — T148 Other injury of unspecified body region: Secondary | ICD-10-CM | POA: Insufficient documentation

## 2015-03-24 DIAGNOSIS — Y92128 Other place in nursing home as the place of occurrence of the external cause: Secondary | ICD-10-CM | POA: Insufficient documentation

## 2015-03-24 DIAGNOSIS — T1490XA Injury, unspecified, initial encounter: Secondary | ICD-10-CM

## 2015-03-24 DIAGNOSIS — N289 Disorder of kidney and ureter, unspecified: Secondary | ICD-10-CM | POA: Diagnosis not present

## 2015-03-24 DIAGNOSIS — E785 Hyperlipidemia, unspecified: Secondary | ICD-10-CM | POA: Diagnosis not present

## 2015-03-24 LAB — CBC WITH DIFFERENTIAL/PLATELET
Basophils Absolute: 0 10*3/uL (ref 0.0–0.1)
Basophils Relative: 1 % (ref 0–1)
EOS ABS: 0 10*3/uL (ref 0.0–0.7)
EOS PCT: 0 % (ref 0–5)
HCT: 37.1 % (ref 36.0–46.0)
HEMOGLOBIN: 11.3 g/dL — AB (ref 12.0–15.0)
LYMPHS ABS: 1.3 10*3/uL (ref 0.7–4.0)
Lymphocytes Relative: 26 % (ref 12–46)
MCH: 26.6 pg (ref 26.0–34.0)
MCHC: 30.5 g/dL (ref 30.0–36.0)
MCV: 87.3 fL (ref 78.0–100.0)
Monocytes Absolute: 0.5 10*3/uL (ref 0.1–1.0)
Monocytes Relative: 9 % (ref 3–12)
NEUTROS PCT: 64 % (ref 43–77)
Neutro Abs: 3.2 10*3/uL (ref 1.7–7.7)
Platelets: 228 10*3/uL (ref 150–400)
RBC: 4.25 MIL/uL (ref 3.87–5.11)
RDW: 14.2 % (ref 11.5–15.5)
WBC: 5 10*3/uL (ref 4.0–10.5)

## 2015-03-24 LAB — COMPREHENSIVE METABOLIC PANEL
ALT: 17 U/L (ref 14–54)
AST: 18 U/L (ref 15–41)
Albumin: 3.6 g/dL (ref 3.5–5.0)
Alkaline Phosphatase: 58 U/L (ref 38–126)
Anion gap: 7 (ref 5–15)
BILIRUBIN TOTAL: 0.8 mg/dL (ref 0.3–1.2)
BUN: 38 mg/dL — ABNORMAL HIGH (ref 6–20)
CO2: 28 mmol/L (ref 22–32)
Calcium: 9.5 mg/dL (ref 8.9–10.3)
Chloride: 104 mmol/L (ref 101–111)
Creatinine, Ser: 1.22 mg/dL — ABNORMAL HIGH (ref 0.44–1.00)
GFR calc Af Amer: 44 mL/min — ABNORMAL LOW (ref >60)
GFR, EST NON AFRICAN AMERICAN: 38 mL/min — AB (ref >60)
Glucose, Bld: 173 mg/dL — ABNORMAL HIGH (ref 65–99)
Potassium: 4.8 mmol/L (ref 3.5–5.1)
Sodium: 139 mmol/L (ref 135–145)
Total Protein: 7.1 g/dL (ref 6.5–8.1)

## 2015-03-24 LAB — TROPONIN I: Troponin I: <0.03 ng/mL (ref <0.031)

## 2015-03-24 MED ORDER — SODIUM CHLORIDE 0.9 % IV BOLUS (SEPSIS)
500.0000 mL | Freq: Once | INTRAVENOUS | Status: AC
  Administered 2015-03-24: 500 mL via INTRAVENOUS

## 2015-03-24 MED ORDER — SODIUM CHLORIDE 0.9 % IV SOLN
INTRAVENOUS | Status: DC
  Administered 2015-03-24: 20:00:00 via INTRAVENOUS

## 2015-03-24 NOTE — ED Provider Notes (Signed)
CSN: [id number]     Arrival date & time 03/24/15  1747 History   First MD Initiated Contact with Patient 03/24/15 1756     Chief Complaint  Patient presents with  . Fall     (Consider location/radiation/quality/duration/timing/severity/associated sxs/prior Treatment) HPI Comments: Patient here after witnessed fall at the nursing home prior to arrival. I spoke with nursing home staff and said that while walking to the dining room she lost her balance and fell and hit a column. There was no loss of consciousness. She is at her baseline which is dementia. No reported vomiting. No reported recent medication changes according to the patient's old records. Presented by EMS  Patient is a 81 y.o. female presenting with fall. The history is provided by a caregiver and the patient. The history is limited by the condition of the patient.  Fall    Past Medical History  Diagnosis Date  . Arthritis   . Thyroid disease   . Hyperlipemia   . Diabetes mellitus without complication   . Alzheimer disease   . Abnormal gait   . Depression    Past Surgical History  Procedure Laterality Date  . Cholecystectomy     No family history on file. History  Substance Use Topics  . Smoking status: Never Smoker   . Smokeless tobacco: Never Used  . Alcohol Use: No   OB History    No data available     Review of Systems  Unable to perform ROS     Allergies  Review of patient's allergies indicates no known allergies.  Home Medications   Prior to Admission medications   Medication Sig Start Date End Date Taking? Authorizing Provider  acetaminophen (TYLENOL) 650 MG CR tablet Take 650 mg by mouth every 6 (six) hours as needed for pain.    Yes Historical Provider, MD  Acetic Acid (DOUCHE VINEGAR/WATER VA) Place 1 each vaginally once a week. Mix 1 tbsp of vinegar in 1 pint of warm water and wash perineal area.   Yes Historical Provider, MD  cholecalciferol (VITAMIN D) 400 UNITS TABS tablet Take 800  Units by mouth daily.   Yes Historical Provider, MD  donepezil (ARICEPT) 10 MG tablet Take 10 mg by mouth at bedtime.   Yes Historical Provider, MD  furosemide (LASIX) 20 MG tablet Take 20 mg by mouth daily.   Yes Historical Provider, MD  glipiZIDE (GLUCOTROL) 10 MG tablet Take 10 mg by mouth 2 (two) times daily.   Yes Historical Provider, MD  levothyroxine (SYNTHROID, LEVOTHROID) 50 MCG tablet Take 50 mcg by mouth daily before breakfast.    Yes Historical Provider, MD  lisinopril (PRINIVIL,ZESTRIL) 2.5 MG tablet Take 2.5 mg by mouth daily.    Yes Historical Provider, MD  metFORMIN (GLUCOPHAGE) 500 MG tablet Take 500 mg by mouth 2 (two) times daily.   Yes Historical Provider, MD  Nutritional Supplements (NUTRITIONAL SHAKE PO) Take 1 Container by mouth 3 (three) times daily between meals. Mighty Shakes   Yes Historical Provider, MD  potassium chloride (K-DUR) 10 MEQ tablet Take 20 mEq by mouth daily.   Yes Historical Provider, MD  protein supplement (RESOURCE BENEPROTEIN) POWD Take 2 scoop by mouth 3 (three) times daily with meals. In 4 ounces of water.   Yes Historical Provider, MD  simvastatin (ZOCOR) 10 MG tablet Take 10 mg by mouth at bedtime.   Yes Historical Provider, MD   BP 90/74 mmHg  Pulse 66  Temp(Src) 98.6 F (37 C) (Oral)  Resp  14  Ht  (1.651 m)  Wt 180 lb (81.647 kg)  BMI 29.95 kg/m2  SpO2 96% Physical Exam  Constitutional: She is oriented to person, place, and time. She appears well-developed and well-nourished.  Non-toxic appearance. No distress.  HENT:  Head: Normocephalic and atraumatic.  Eyes: Conjunctivae, EOM and lids are normal. Pupils are equal, round, and reactive to light.  Neck: Normal range of motion. Neck supple. No tracheal deviation present. No thyroid mass present.  Cardiovascular: Normal rate, regular rhythm and normal heart sounds.  Exam reveals no gallop.   No murmur heard. Pulmonary/Chest: Effort normal and breath sounds normal. No stridor. No  respiratory distress. She has no decreased breath sounds. She has no wheezes. She has no rhonchi. She has no rales.  Abdominal: Soft. Normal appearance and bowel sounds are normal. She exhibits no distension. There is no tenderness. There is no rebound and no CVA tenderness.  Musculoskeletal: Normal range of motion. She exhibits no edema or tenderness.  Neurological: She is alert and oriented to person, place, and time. She has normal strength. No cranial nerve deficit or sensory deficit. GCS eye subscore is 4. GCS verbal subscore is 5. GCS motor subscore is 6.  Skin: Skin is warm and dry. No abrasion and no rash noted.  Psychiatric: She has a normal mood and affect. Her speech is normal and behavior is normal.  Nursing note and vitals reviewed.   ED Course  Procedures (including critical care time) Labs Review Labs Reviewed - No data to display  Imaging Review No results found.   EKG Interpretation   Date/Time:  Monday March 24 2015 18:39:04 EDT Ventricular Rate:  69 PR Interval:  142 QRS Duration: 81 QT Interval:  392 QTC Calculation: 420 R Axis:   42 Text Interpretation:  Sinus rhythm Ventricular premature complex Confirmed  by Larsen Dungan  MD, Audriana Aldama ([id number]) on 03/24/2015 8:01:19 PM      MDM   Final diagnoses:  Trauma  Trauma    Patients repeat blood pressure is stable. Workup here shows possibly renal insufficiency. Will recommend the patient follow-up with her Dr. for repeat BUN/creatinine    Lorre Nick, MD 03/24/15 2001

## 2015-03-24 NOTE — ED Notes (Signed)
Bed: WA08 Expected date:  Expected time:  Means of arrival:  Comments: EMS/81yo/fall

## 2015-03-24 NOTE — Discharge Instructions (Signed)
Your creatinine today was 1.22. Follow-up with your doctor later this week for repeat renal function test

## 2015-03-24 NOTE — ED Notes (Addendum)
Pt from [facility]. Pt had a witnessed fall at the SNF today. Hit head. Denies injury or complaints.Red birthmark on base of head is not new. Hx of alzheimer's disease. At baseline mentation per SNF staff.

## 2015-11-27 ENCOUNTER — Encounter (HOSPITAL_COMMUNITY): Payer: Self-pay | Admitting: *Deleted

## 2015-11-27 ENCOUNTER — Emergency Department (HOSPITAL_COMMUNITY)
Admission: EM | Admit: 2015-11-27 | Discharge: 2015-11-28 | Disposition: A | Discharge status: Home / Self Care | Payer: Medicare Other | Attending: Emergency Medicine | Admitting: Emergency Medicine

## 2015-11-27 DIAGNOSIS — M1990 Unspecified osteoarthritis, unspecified site: Secondary | ICD-10-CM | POA: Insufficient documentation

## 2015-11-27 DIAGNOSIS — S6992XA Unspecified injury of left wrist, hand and finger(s), initial encounter: Secondary | ICD-10-CM | POA: Diagnosis not present

## 2015-11-27 DIAGNOSIS — Z7984 Long term (current) use of oral hypoglycemic drugs: Secondary | ICD-10-CM | POA: Diagnosis not present

## 2015-11-27 DIAGNOSIS — W19XXXA Unspecified fall, initial encounter: Secondary | ICD-10-CM

## 2015-11-27 DIAGNOSIS — J3489 Other specified disorders of nose and nasal sinuses: Secondary | ICD-10-CM | POA: Diagnosis not present

## 2015-11-27 DIAGNOSIS — Z79899 Other long term (current) drug therapy: Secondary | ICD-10-CM | POA: Diagnosis not present

## 2015-11-27 DIAGNOSIS — E079 Disorder of thyroid, unspecified: Secondary | ICD-10-CM | POA: Diagnosis not present

## 2015-11-27 DIAGNOSIS — Y9389 Activity, other specified: Secondary | ICD-10-CM | POA: Diagnosis not present

## 2015-11-27 DIAGNOSIS — W06XXXA Fall from bed, initial encounter: Secondary | ICD-10-CM | POA: Diagnosis not present

## 2015-11-27 DIAGNOSIS — Y998 Other external cause status: Secondary | ICD-10-CM | POA: Diagnosis not present

## 2015-11-27 DIAGNOSIS — E119 Type 2 diabetes mellitus without complications: Secondary | ICD-10-CM | POA: Insufficient documentation

## 2015-11-27 DIAGNOSIS — G309 Alzheimer's disease, unspecified: Secondary | ICD-10-CM | POA: Insufficient documentation

## 2015-11-27 DIAGNOSIS — Y92128 Other place in nursing home as the place of occurrence of the external cause: Secondary | ICD-10-CM | POA: Insufficient documentation

## 2015-11-27 DIAGNOSIS — Z8659 Personal history of other mental and behavioral disorders: Secondary | ICD-10-CM | POA: Insufficient documentation

## 2015-11-27 NOTE — ED Notes (Addendum)
Pt arrives via EMS from [facility]. Pt had unwitnessed fall while attempting to get out of bed. Denies pain. Pt hx dementia. c-collar in place. No deformities. BP 150/76 HR 76 99% RA. CBG 172. Full code were facility.

## 2015-11-28 ENCOUNTER — Emergency Department (HOSPITAL_COMMUNITY): Payer: Medicare Other

## 2015-11-28 NOTE — ED Notes (Signed)
Pt back from radiology 

## 2015-11-28 NOTE — ED Provider Notes (Signed)
CSN: [id number]     Arrival date & time 11/27/15  2342 History  By signing my name below, I, Arianna Nassar, attest that this documentation has been prepared under the direction and in the presence of Tomasita Crumble, MD. Electronically Signed: Octavia Heir, ED Scribe. 11/28/2015. 12:18 AM.     Chief Complaint  Patient presents with  . Fall   LEVEL 5 CAVEAT DUE TO ALTERED MENTAL STATUS   The history is provided by the EMS personnel and the nursing home. The history is limited by the condition of the patient.   HPI Comments: Cassidy Paul is a 82 y.o. female who has a hx of dementia, hyperlipidemia, DM, and thyroid disease presents by EMS to the Emergency Department complaining of a fall that occurred this evening. According to the nursing home staff at [facility], pt had an unwitnessed fall while attempting to get out of bed. When asked what happened pt stated "I was going to work". There are no other injuries and pt denies pain. She has a c-collar in place.   Past Medical History  Diagnosis Date  . Arthritis   . Thyroid disease   . Hyperlipemia   . Diabetes mellitus without complication (HCC)   . Alzheimer disease   . Abnormal gait   . Depression    Past Surgical History  Procedure Laterality Date  . Cholecystectomy     No family history on file. Social History  Substance Use Topics  . Smoking status: Never Smoker   . Smokeless tobacco: Never Used  . Alcohol Use: No   OB History    No data available     Review of Systems  LEVEL 5 CAVEAT DUE TO ALTERED MENTAL STATUS  Allergies  Review of patient's allergies indicates no known allergies.  Home Medications   Prior to Admission medications   Medication Sig Start Date End Date Taking? Authorizing Provider  acetaminophen (TYLENOL) 650 MG CR tablet Take 650 mg by mouth every 6 (six) hours as needed for pain.     Historical Provider, MD  Acetic Acid (DOUCHE VINEGAR/WATER VA) Place 1 each vaginally once a week. Mix  1 tbsp of vinegar in 1 pint of warm water and wash perineal area.    Historical Provider, MD  cholecalciferol (VITAMIN D) 400 UNITS TABS tablet Take 800 Units by mouth daily.    Historical Provider, MD  donepezil (ARICEPT) 10 MG tablet Take 10 mg by mouth at bedtime.    Historical Provider, MD  furosemide (LASIX) 20 MG tablet Take 20 mg by mouth daily.    Historical Provider, MD  glipiZIDE (GLUCOTROL) 10 MG tablet Take 10 mg by mouth 2 (two) times daily.    Historical Provider, MD  levothyroxine (SYNTHROID, LEVOTHROID) 50 MCG tablet Take 50 mcg by mouth daily before breakfast.     Historical Provider, MD  lisinopril (PRINIVIL,ZESTRIL) 2.5 MG tablet Take 2.5 mg by mouth daily.     Historical Provider, MD  metFORMIN (GLUCOPHAGE) 500 MG tablet Take 500 mg by mouth 2 (two) times daily.    Historical Provider, MD  Nutritional Supplements (NUTRITIONAL SHAKE PO) Take 1 Container by mouth 3 (three) times daily between meals. Mighty Shakes    Historical Provider, MD  potassium chloride (K-DUR) 10 MEQ tablet Take 20 mEq by mouth daily.    Historical Provider, MD  protein supplement (RESOURCE BENEPROTEIN) POWD Take 2 scoop by mouth 3 (three) times daily with meals. In 4 ounces of water.    Historical  Provider, MD  simvastatin (ZOCOR) 10 MG tablet Take 10 mg by mouth at bedtime.    Historical Provider, MD   Triage vitals: BP 171/76 mmHg  Pulse 74  Temp(Src) 98.2 F (36.8 C) (Oral)  Resp 18  SpO2 98% Physical Exam  Constitutional: She is oriented to person, place, and time. She appears well-developed and well-nourished. No distress.  HENT:  Head: Normocephalic and atraumatic.  Right Ear: Tympanic membrane, external ear and ear canal normal.  Left Ear: Tympanic membrane, external ear and ear canal normal.  Nose: Mucosal edema and rhinorrhea present. No epistaxis. Right sinus exhibits no maxillary sinus tenderness and no frontal sinus tenderness. Left sinus exhibits no maxillary sinus tenderness and no  frontal sinus tenderness.  Mouth/Throat: Uvula is midline, oropharynx is clear and moist and mucous membranes are normal. Mucous membranes are not pale and not cyanotic. No oropharyngeal exudate, posterior oropharyngeal edema, posterior oropharyngeal erythema or tonsillar abscesses.  Eyes: Conjunctivae and EOM are normal. Pupils are equal, round, and reactive to light. No scleral icterus.  Neck: Normal range of motion and full passive range of motion without pain. Neck supple. No JVD present. No tracheal deviation present. No thyromegaly present.  c-collar in place  Cardiovascular: Normal rate, regular rhythm, normal heart sounds and intact distal pulses.  Exam reveals no gallop and no friction rub.   No murmur heard. Pulmonary/Chest: Effort normal and breath sounds normal. No stridor. No respiratory distress. She has no wheezes. She exhibits no tenderness.  Clear and equal breath sounds without focal wheezes, rhonchi, rales  Abdominal: Soft. Bowel sounds are normal. She exhibits no distension and no mass. There is no tenderness. There is no rebound and no guarding.  Musculoskeletal: Normal range of motion. She exhibits no edema or tenderness.  Lymphadenopathy:    She has no cervical adenopathy.  Neurological: She is alert and oriented to person, place, and time. No cranial nerve deficit. She exhibits normal muscle tone.  Normal strength and sensation in all extremities, normal cerebellar testing  Skin: Skin is warm and dry. No rash noted. No erythema. No pallor.  Psychiatric: She has a normal mood and affect.  Nursing note and vitals reviewed.   ED Course  Procedures  DIAGNOSTIC STUDIES: Oxygen Saturation is 98% on RA, normal by my interpretation.  COORDINATION OF CARE:  12:18 AM Discussed treatment plan with pt at bedside and pt agreed to plan.  Labs Review Labs Reviewed - No data to display  Imaging Review Dg Wrist Complete Left  11/28/2015  CLINICAL DATA:  Unwitnessed fall while  getting out of bed at nursing home. Dementia, poor historian. EXAM: LEFT WRIST - COMPLETE 3+ VIEW COMPARISON:  None. FINDINGS: There is no evidence of fracture or dislocation. Osteopenia. There is no evidence of arthropathy or other focal bone abnormality. Soft tissues are unremarkable. IMPRESSION: Negative. Electronically Signed   By: Awilda Metro M.D.   On: 11/28/2015 01:50   Ct Head Wo Contrast  11/28/2015  CLINICAL DATA:  Unwitnessed fall while getting out of bed. No pain. Dementia. C-collar in place. EXAM: CT HEAD WITHOUT CONTRAST CT CERVICAL SPINE WITHOUT CONTRAST TECHNIQUE: Multidetector CT imaging of the head and cervical spine was performed following the standard protocol without intravenous contrast. Multiplanar CT image reconstructions of the cervical spine were also generated. COMPARISON:  03/24/2015 FINDINGS: CT HEAD FINDINGS Diffuse cerebral atrophy. Prominent ventricular dilatation is likely due to central atrophy although it is somewhat asymmetric to the degree of cortical atrophy and normal pressure hydrocephalus  could also be considered. This appearance is similar to previous study. Low-attenuation changes in the deep white matter consistent with small vessel ischemia. No mass effect or midline shift. No abnormal extra-axial fluid collections. Gray-white matter junctions are distinct. Basal cisterns are not effaced. No evidence of acute intracranial hemorrhage. No depressed skull fractures. Visualized paranasal sinuses and mastoid air cells are not opacified. CT CERVICAL SPINE FINDINGS Normal alignment of the cervical spine. Degenerative changes throughout with narrowed cervical interspaces and associated endplate hypertrophic changes. No vertebral compression deformities. No prevertebral soft tissue swelling. No focal bone lesion or bone destruction. C1-2 articulation appears intact. Probable thoracic scoliosis. Soft tissues are unremarkable. Vascular calcifications in the cervical carotid  arteries. IMPRESSION: No acute intracranial abnormalities. Chronic atrophy and small vessel ischemic changes. Disproportionate ventricular dilatation probably due to prominent central atrophy but normal pressure hydrocephalus could also have this appearance. No change since prior study. Degenerative changes throughout the cervical spine. Normal alignment. No acute displaced fractures identified. Electronically Signed   By: Burman Nieves M.D.   On: 11/28/2015 01:36   Ct Cervical Spine Wo Contrast  11/28/2015  CLINICAL DATA:  Unwitnessed fall while getting out of bed. No pain. Dementia. C-collar in place. EXAM: CT HEAD WITHOUT CONTRAST CT CERVICAL SPINE WITHOUT CONTRAST TECHNIQUE: Multidetector CT imaging of the head and cervical spine was performed following the standard protocol without intravenous contrast. Multiplanar CT image reconstructions of the cervical spine were also generated. COMPARISON:  03/24/2015 FINDINGS: CT HEAD FINDINGS Diffuse cerebral atrophy. Prominent ventricular dilatation is likely due to central atrophy although it is somewhat asymmetric to the degree of cortical atrophy and normal pressure hydrocephalus could also be considered. This appearance is similar to previous study. Low-attenuation changes in the deep white matter consistent with small vessel ischemia. No mass effect or midline shift. No abnormal extra-axial fluid collections. Gray-white matter junctions are distinct. Basal cisterns are not effaced. No evidence of acute intracranial hemorrhage. No depressed skull fractures. Visualized paranasal sinuses and mastoid air cells are not opacified. CT CERVICAL SPINE FINDINGS Normal alignment of the cervical spine. Degenerative changes throughout with narrowed cervical interspaces and associated endplate hypertrophic changes. No vertebral compression deformities. No prevertebral soft tissue swelling. No focal bone lesion or bone destruction. C1-2 articulation appears intact. Probable  thoracic scoliosis. Soft tissues are unremarkable. Vascular calcifications in the cervical carotid arteries. IMPRESSION: No acute intracranial abnormalities. Chronic atrophy and small vessel ischemic changes. Disproportionate ventricular dilatation probably due to prominent central atrophy but normal pressure hydrocephalus could also have this appearance. No change since prior study. Degenerative changes throughout the cervical spine. Normal alignment. No acute displaced fractures identified. Electronically Signed   By: Burman Nieves M.D.   On: 11/28/2015 01:36   I have personally reviewed and evaluated these images and lab results as part of my medical decision-making.   EKG Interpretation None      MDM   Final diagnoses:  None   Patient presents to the ED for an unwitnessed fall.  There are no signs of external injury, however I will obtain CT scan given her age.  She points to her L wrist when she states she has pain.  Again, no signs of injury there but will obtain xrays.     CT scans and x-rays are negative for any injuries. Patient continues to appear well in no acute distress, her vital signs within her normal limits and she is safe for discharge.   I personally performed the services described in this documentation,  which was scribed in my presence. The recorded information has been reviewed and is accurate.     Tomasita Crumble, MD 11/28/15 [phone]

## 2015-11-28 NOTE — Discharge Instructions (Signed)
Fall Prevention in the Home  Ms. Nordgren, your CT scans and x-rays today are negative. See your primary care physician within 3 days for close follow-up. If any symptoms worsen come back to emergency department immediately. Thank you. Falls can cause injuries. They can happen to people of all ages. There are many things you can do to make your home safe and to help prevent falls.  WHAT CAN I DO ON THE OUTSIDE OF MY HOME?  Regularly fix the edges of walkways and driveways and fix any cracks.  Remove anything that might make you trip as you walk through a door, such as a raised step or threshold.  Trim any bushes or trees on the path to your home.  Use bright outdoor lighting.  Clear any walking paths of anything that might make someone trip, such as rocks or tools.  Regularly check to see if handrails are loose or broken. Make sure that both sides of any steps have handrails.  Any raised decks and porches should have guardrails on the edges.  Have any leaves, snow, or ice cleared regularly.  Use sand or salt on walking paths during winter.  Clean up any spills in your garage right away. This includes oil or grease spills. WHAT CAN I DO IN THE BATHROOM?   Use night lights.  Install grab bars by the toilet and in the tub and shower. Do not use towel bars as grab bars.  Use non-skid mats or decals in the tub or shower.  If you need to sit down in the shower, use a plastic, non-slip stool.  Keep the floor dry. Clean up any water that spills on the floor as soon as it happens.  Remove soap buildup in the tub or shower regularly.  Attach bath mats securely with double-sided non-slip rug tape.  Do not have throw rugs and other things on the floor that can make you trip. WHAT CAN I DO IN THE BEDROOM?  Use night lights.  Make sure that you have a light by your bed that is easy to reach.  Do not use any sheets or blankets that are too big for your bed. They should not hang down onto  the floor.  Have a firm chair that has side arms. You can use this for support while you get dressed.  Do not have throw rugs and other things on the floor that can make you trip. WHAT CAN I DO IN THE KITCHEN?  Clean up any spills right away.  Avoid walking on wet floors.  Keep items that you use a lot in easy-to-reach places.  If you need to reach something above you, use a strong step stool that has a grab bar.  Keep electrical cords out of the way.  Do not use floor polish or wax that makes floors slippery. If you must use wax, use non-skid floor wax.  Do not have throw rugs and other things on the floor that can make you trip. WHAT CAN I DO WITH MY STAIRS?  Do not leave any items on the stairs.  Make sure that there are handrails on both sides of the stairs and use them. Fix handrails that are broken or loose. Make sure that handrails are as long as the stairways.  Check any carpeting to make sure that it is firmly attached to the stairs. Fix any carpet that is loose or worn.  Avoid having throw rugs at the top or bottom of the stairs. If you  do have throw rugs, attach them to the floor with carpet tape.  Make sure that you have a light switch at the top of the stairs and the bottom of the stairs. If you do not have them, ask someone to add them for you. WHAT ELSE CAN I DO TO HELP PREVENT FALLS?  Wear shoes that:  Do not have high heels.  Have rubber bottoms.  Are comfortable and fit you well.  Are closed at the toe. Do not wear sandals.  If you use a stepladder:  Make sure that it is fully opened. Do not climb a closed stepladder.  Make sure that both sides of the stepladder are locked into place.  Ask someone to hold it for you, if possible.  Clearly mark and make sure that you can see:  Any grab bars or handrails.  First and last steps.  Where the edge of each step is.  Use tools that help you move around (mobility aids) if they are needed. These  include:  Canes.  Walkers.  Scooters.  Crutches.  Turn on the lights when you go into a dark area. Replace any light bulbs as soon as they burn out.  Set up your furniture so you have a clear path. Avoid moving your furniture around.  If any of your floors are uneven, fix them.  If there are any pets around you, be aware of where they are.  Review your medicines with your doctor. Some medicines can make you feel dizzy. This can increase your chance of falling. Ask your doctor what other things that you can do to help prevent falls.   This information is not intended to replace advice given to you by your health care provider. Make sure you discuss any questions you have with your health care provider.   Document Released: 07/24/2009 Document Revised: 02/11/2015 Document Reviewed: 11/01/2014 Elsevier Interactive Patient Education [employer]

## 2015-11-29 ENCOUNTER — Emergency Department (HOSPITAL_COMMUNITY)
Admission: EM | Admit: 2015-11-29 | Discharge: 2015-11-29 | Disposition: A | Discharge status: Home / Self Care | Payer: Medicare Other | Attending: Emergency Medicine | Admitting: Emergency Medicine

## 2015-11-29 ENCOUNTER — Encounter (HOSPITAL_COMMUNITY): Payer: Self-pay | Admitting: Emergency Medicine

## 2015-11-29 DIAGNOSIS — Y9289 Other specified places as the place of occurrence of the external cause: Secondary | ICD-10-CM | POA: Insufficient documentation

## 2015-11-29 DIAGNOSIS — E079 Disorder of thyroid, unspecified: Secondary | ICD-10-CM | POA: Diagnosis not present

## 2015-11-29 DIAGNOSIS — M1990 Unspecified osteoarthritis, unspecified site: Secondary | ICD-10-CM | POA: Diagnosis not present

## 2015-11-29 DIAGNOSIS — W19XXXA Unspecified fall, initial encounter: Secondary | ICD-10-CM

## 2015-11-29 DIAGNOSIS — R011 Cardiac murmur, unspecified: Secondary | ICD-10-CM | POA: Insufficient documentation

## 2015-11-29 DIAGNOSIS — Z8739 Personal history of other diseases of the musculoskeletal system and connective tissue: Secondary | ICD-10-CM | POA: Insufficient documentation

## 2015-11-29 DIAGNOSIS — Z043 Encounter for examination and observation following other accident: Secondary | ICD-10-CM | POA: Insufficient documentation

## 2015-11-29 DIAGNOSIS — W06XXXA Fall from bed, initial encounter: Secondary | ICD-10-CM | POA: Diagnosis not present

## 2015-11-29 DIAGNOSIS — Y998 Other external cause status: Secondary | ICD-10-CM | POA: Insufficient documentation

## 2015-11-29 DIAGNOSIS — G309 Alzheimer's disease, unspecified: Secondary | ICD-10-CM | POA: Insufficient documentation

## 2015-11-29 DIAGNOSIS — Y9389 Activity, other specified: Secondary | ICD-10-CM | POA: Insufficient documentation

## 2015-11-29 DIAGNOSIS — E119 Type 2 diabetes mellitus without complications: Secondary | ICD-10-CM | POA: Insufficient documentation

## 2015-11-29 DIAGNOSIS — Z79899 Other long term (current) drug therapy: Secondary | ICD-10-CM | POA: Diagnosis not present

## 2015-11-29 DIAGNOSIS — E785 Hyperlipidemia, unspecified: Secondary | ICD-10-CM | POA: Insufficient documentation

## 2015-11-29 DIAGNOSIS — Z8659 Personal history of other mental and behavioral disorders: Secondary | ICD-10-CM | POA: Insufficient documentation

## 2015-11-29 NOTE — ED Notes (Signed)
Spoke with pt about ambulating in the hall, pt states she uses a wheel chair at her facility.

## 2015-11-29 NOTE — ED Provider Notes (Signed)
CSN: [id number]     Arrival date & time 11/29/15  0133 History  By signing my name below, I, Emmanuella Mensah, attest that this documentation has been prepared under the direction and in the presence of Derwood Kaplan, MD. Electronically Signed: Angelene Giovanni, ED Scribe. 11/29/2015. 2:36 AM.    Chief Complaint  Patient presents with  . Fall   The history is provided by the patient. No language interpreter was used.   HPI Comments: BRAIDYN PEACE is a 82 y.o. female with a hx of arthritis, DM, and alzheimer disease who presents to the Emergency Department s/p fall that occurred PTA. She explains that she fell as she tried to pick up her remote that had fallen on the ground. She states that she ambulates with a walker at baseline. She states that she was unable to get up from the fall for several minutes until she was found by her staff. She denies any LOC or head injuries. No n/v, bowel/bladder incontinence, or numbness/tingling.    Past Medical History  Diagnosis Date  . Arthritis   . Thyroid disease   . Hyperlipemia   . Diabetes mellitus without complication (HCC)   . Alzheimer disease   . Abnormal gait   . Depression    Past Surgical History  Procedure Laterality Date  . Cholecystectomy     No family history on file. Social History  Substance Use Topics  . Smoking status: Never Smoker   . Smokeless tobacco: Never Used  . Alcohol Use: No   OB History    No data available     Review of Systems  Gastrointestinal: Negative for nausea and vomiting.  Musculoskeletal: Back pain: mild.  Neurological: Negative for numbness.      Allergies  Review of patient's allergies indicates no known allergies.  Home Medications   Prior to Admission medications   Medication Sig Start Date End Date Taking? Authorizing Provider  acetaminophen (TYLENOL) 650 MG CR tablet Take 650 mg by mouth every 6 (six) hours as needed for pain.     Historical Provider, MD  cholecalciferol (VITAMIN  D) 400 UNITS TABS tablet Take 800 Units by mouth daily.    Historical Provider, MD  donepezil (ARICEPT) 10 MG tablet Take 10 mg by mouth at bedtime.    Historical Provider, MD  furosemide (LASIX) 20 MG tablet Take 20 mg by mouth daily.    Historical Provider, MD  glipiZIDE (GLUCOTROL) 10 MG tablet Take 10 mg by mouth 2 (two) times daily.    Historical Provider, MD  levothyroxine (SYNTHROID, LEVOTHROID) 50 MCG tablet Take 50 mcg by mouth daily before breakfast.     Historical Provider, MD  lisinopril (PRINIVIL,ZESTRIL) 2.5 MG tablet Take 2.5 mg by mouth daily.     Historical Provider, MD  loratadine (CLARITIN) 10 MG tablet Take 10 mg by mouth daily as needed for allergies.    Historical Provider, MD  metFORMIN (GLUCOPHAGE) 500 MG tablet Take 500 mg by mouth 2 (two) times daily.    Historical Provider, MD  potassium chloride (K-DUR) 10 MEQ tablet Take 20 mEq by mouth daily.    Historical Provider, MD  protein supplement (RESOURCE BENEPROTEIN) POWD Take 2 scoop by mouth 3 (three) times daily with meals. In 4 ounces of water.    Historical Provider, MD   BP 132/57 mmHg  Pulse 84  Temp(Src) 98.5 F (36.9 C) (Oral)  Resp 18  SpO2 100% Physical Exam  Constitutional: She appears well-developed and well-nourished. No distress.  HENT:  Head: Normocephalic and atraumatic.  Mouth/Throat: Oropharynx is clear and moist. No oropharyngeal exudate.  Scalp exam reveals no hematoma or bleeding  Eyes: Conjunctivae and EOM are normal. Pupils are equal, round, and reactive to light. Right eye exhibits no discharge. Left eye exhibits no discharge. No scleral icterus.  Neck: Normal range of motion. Neck supple. No JVD present. No thyromegaly present.  Cardiovascular: Normal rate, regular rhythm and intact distal pulses.  Exam reveals no gallop and no friction rub.   Murmur heard. Pulmonary/Chest: Effort normal and breath sounds normal. No respiratory distress. She has no wheezes. She has no rales.  Abdominal:  Soft. Bowel sounds are normal. She exhibits no distension and no mass. There is no tenderness.  Genitourinary:  Pelvis is stable  Musculoskeletal: Normal range of motion. She exhibits no edema or tenderness.  Upper extremity exam reveals no deformity or tenderness No thoracic or lumbar tenderness No deformity of lower extremities, no TTP.  Lymphadenopathy:    She has no cervical adenopathy.  Neurological: She is alert. Coordination normal.  Skin: Skin is warm and dry. No rash noted. No erythema.  Psychiatric: She has a normal mood and affect. Her behavior is normal.  Nursing note and vitals reviewed.   ED Course  Procedures (including critical care time) DIAGNOSTIC STUDIES: Oxygen Saturation is 100% on RA, normal by my interpretation.    COORDINATION OF CARE: 2:13 AM- Pt advised of plan for treatment and pt agrees.    4:10 am: Pt sleeping, but arousable. She is aware that she is in the hospital. Still has no headaches. No emesis, seizures here. Will d.c.   Labs Review Labs Reviewed - No data to display  Imaging Review Dg Wrist Complete Left  11/28/2015  CLINICAL DATA:  Unwitnessed fall while getting out of bed at nursing home. Dementia, poor historian. EXAM: LEFT WRIST - COMPLETE 3+ VIEW COMPARISON:  None. FINDINGS: There is no evidence of fracture or dislocation. Osteopenia. There is no evidence of arthropathy or other focal bone abnormality. Soft tissues are unremarkable. IMPRESSION: Negative. Electronically Signed   By: Awilda Metro M.D.   On: 11/28/2015 01:50   Ct Head Wo Contrast  11/28/2015  CLINICAL DATA:  Unwitnessed fall while getting out of bed. No pain. Dementia. C-collar in place. EXAM: CT HEAD WITHOUT CONTRAST CT CERVICAL SPINE WITHOUT CONTRAST TECHNIQUE: Multidetector CT imaging of the head and cervical spine was performed following the standard protocol without intravenous contrast. Multiplanar CT image reconstructions of the cervical spine were also generated.  COMPARISON:  03/24/2015 FINDINGS: CT HEAD FINDINGS Diffuse cerebral atrophy. Prominent ventricular dilatation is likely due to central atrophy although it is somewhat asymmetric to the degree of cortical atrophy and normal pressure hydrocephalus could also be considered. This appearance is similar to previous study. Low-attenuation changes in the deep white matter consistent with small vessel ischemia. No mass effect or midline shift. No abnormal extra-axial fluid collections. Gray-white matter junctions are distinct. Basal cisterns are not effaced. No evidence of acute intracranial hemorrhage. No depressed skull fractures. Visualized paranasal sinuses and mastoid air cells are not opacified. CT CERVICAL SPINE FINDINGS Normal alignment of the cervical spine. Degenerative changes throughout with narrowed cervical interspaces and associated endplate hypertrophic changes. No vertebral compression deformities. No prevertebral soft tissue swelling. No focal bone lesion or bone destruction. C1-2 articulation appears intact. Probable thoracic scoliosis. Soft tissues are unremarkable. Vascular calcifications in the cervical carotid arteries. IMPRESSION: No acute intracranial abnormalities. Chronic atrophy and small vessel ischemic changes.  Disproportionate ventricular dilatation probably due to prominent central atrophy but normal pressure hydrocephalus could also have this appearance. No change since prior study. Degenerative changes throughout the cervical spine. Normal alignment. No acute displaced fractures identified. Electronically Signed   By: Burman Nieves M.D.   On: 11/28/2015 01:36   Ct Cervical Spine Wo Contrast  11/28/2015  CLINICAL DATA:  Unwitnessed fall while getting out of bed. No pain. Dementia. C-collar in place. EXAM: CT HEAD WITHOUT CONTRAST CT CERVICAL SPINE WITHOUT CONTRAST TECHNIQUE: Multidetector CT imaging of the head and cervical spine was performed following the standard protocol without  intravenous contrast. Multiplanar CT image reconstructions of the cervical spine were also generated. COMPARISON:  03/24/2015 FINDINGS: CT HEAD FINDINGS Diffuse cerebral atrophy. Prominent ventricular dilatation is likely due to central atrophy although it is somewhat asymmetric to the degree of cortical atrophy and normal pressure hydrocephalus could also be considered. This appearance is similar to previous study. Low-attenuation changes in the deep white matter consistent with small vessel ischemia. No mass effect or midline shift. No abnormal extra-axial fluid collections. Gray-white matter junctions are distinct. Basal cisterns are not effaced. No evidence of acute intracranial hemorrhage. No depressed skull fractures. Visualized paranasal sinuses and mastoid air cells are not opacified. CT CERVICAL SPINE FINDINGS Normal alignment of the cervical spine. Degenerative changes throughout with narrowed cervical interspaces and associated endplate hypertrophic changes. No vertebral compression deformities. No prevertebral soft tissue swelling. No focal bone lesion or bone destruction. C1-2 articulation appears intact. Probable thoracic scoliosis. Soft tissues are unremarkable. Vascular calcifications in the cervical carotid arteries. IMPRESSION: No acute intracranial abnormalities. Chronic atrophy and small vessel ischemic changes. Disproportionate ventricular dilatation probably due to prominent central atrophy but normal pressure hydrocephalus could also have this appearance. No change since prior study. Degenerative changes throughout the cervical spine. Normal alignment. No acute displaced fractures identified. Electronically Signed   By: Burman Nieves M.D.   On: 11/28/2015 01:36   Derwood Kaplan, MD has personally reviewed and evaluated these images and lab results as part of his medical decision-making.   MDM   Final diagnoses:  Fall, initial encounter   I personally performed the services  described in this documentation, which was scribed in my presence. The recorded information has been reviewed and is accurate.   Pt comes in with cc of fall. She has dementia - but is actually alert and able to provide some history. She reports trying to get a remote and having a fall. I dont trust her history completely. She reports shewas on the floor, never struck the head, and EMS had to come nd get her up - the latter is accurate. She also recalls being here yday and getting imaging for fall -which is also correct.  Anyways, besides a small bruise to the hand, pt's exam is completely normal. We will monitor for at least 2 hours, reassess, and d/c w/o any imaging if there is no interval change.  Derwood Kaplan, MD 11/29/15 [phone]

## 2015-11-29 NOTE — ED Notes (Addendum)
Per EMS, they were called to [facility] on [address] as a result of the pt falling out of bed.  Staff reports no LOC and only brusing is on pts left hand.  Pt has a hx of dementia but is alert and oriented to self.  She identifies that she is in [place] but no specifics and she reported that she did not fall out of bed.  Per EMS this is the third fall in two days.

## 2015-11-29 NOTE — Discharge Instructions (Signed)
Cassidy Paul was observed for almost 3 hours and she has had stable neuro exam and no headaches. Her exam shows no signs of severe head trauma. She reports that she doesn't walk - so we havent ambulated her, but she has no pelvic pain or leg pain on our palpation.  We dont think further imaging was required for this visit.   Fall Prevention in Hospitals, Adult As a hospital patient, your condition and the treatments you receive can increase your risk for falls. Some additional risk factors for falls in a hospital include:  Being in an unfamiliar environment.  Being on bed rest.  Your surgery.  Taking certain medicines.  Your tubing requirements, such as intravenous (IV) therapy or catheters. It is important that you learn how to decrease fall risks while at the hospital. Below are important tips that can help prevent falls. SAFETY TIPS FOR PREVENTING FALLS Talk about your risk of falling.  Ask your health care provider why you are at risk for falling. Is it your medicine, illness, tubing placement, or something else?  Make a plan with your health care provider to keep you safe from falls.  Ask your health care provider or pharmacist about side effects of your medicines. Some medicines can make you dizzy or affect your coordination. Ask for help.  Ask for help before getting out of bed. You may need to press your call button.  Ask for assistance in getting safely to the toilet.  Ask for a walker or cane to be put at your bedside. Ask that most of the side rails on your bed be placed up before your health care provider leaves the room.  Ask family or friends to sit with you.  Ask for things that are out of your reach, such as your glasses, hearing aids, telephone, bedside table, or call button. Follow these tips to avoid falling:  Stay lying or seated, rather than standing, while waiting for help.  Wear rubber-soled slippers or shoes whenever you walk in the hospital.  Avoid  quick, sudden movements.  Change positions slowly.  Sit on the side of your bed before standing.  Stand up slowly and wait before you start to walk.  Let your health care provider know if there is a spill on the floor.  Pay careful attention to the medical equipment, electrical cords, and tubes around you.  When you need help, use your call button by your bed or in the bathroom. Wait for one of your health care providers to help you.  If you feel dizzy or unsure of your footing, return to bed and wait for assistance.  Avoid being distracted by the TV, telephone, or another person in your room.  Do not lean or support yourself on rolling objects, such as IV poles or bedside tables.   This information is not intended to replace advice given to you by your health care provider. Make sure you discuss any questions you have with your health care provider.   Document Released: 09/24/2000 Document Revised: 10/18/2014 Document Reviewed: 06/04/2012 Elsevier Interactive Patient Education [employer]

## 2015-12-18 ENCOUNTER — Emergency Department (HOSPITAL_COMMUNITY)
Admission: EM | Admit: 2015-12-18 | Discharge: 2015-12-18 | Disposition: A | Discharge status: Home / Self Care | Payer: Medicare Other | Attending: Emergency Medicine | Admitting: Emergency Medicine

## 2015-12-18 ENCOUNTER — Emergency Department (HOSPITAL_COMMUNITY): Payer: Medicare Other

## 2015-12-18 DIAGNOSIS — M1990 Unspecified osteoarthritis, unspecified site: Secondary | ICD-10-CM | POA: Diagnosis not present

## 2015-12-18 DIAGNOSIS — E119 Type 2 diabetes mellitus without complications: Secondary | ICD-10-CM | POA: Diagnosis not present

## 2015-12-18 DIAGNOSIS — F0280 Dementia in other diseases classified elsewhere without behavioral disturbance: Secondary | ICD-10-CM | POA: Insufficient documentation

## 2015-12-18 DIAGNOSIS — W19XXXA Unspecified fall, initial encounter: Secondary | ICD-10-CM

## 2015-12-18 DIAGNOSIS — G309 Alzheimer's disease, unspecified: Secondary | ICD-10-CM | POA: Insufficient documentation

## 2015-12-18 DIAGNOSIS — Z7984 Long term (current) use of oral hypoglycemic drugs: Secondary | ICD-10-CM | POA: Diagnosis not present

## 2015-12-18 DIAGNOSIS — Z043 Encounter for examination and observation following other accident: Secondary | ICD-10-CM | POA: Diagnosis not present

## 2015-12-18 DIAGNOSIS — Z79899 Other long term (current) drug therapy: Secondary | ICD-10-CM | POA: Insufficient documentation

## 2015-12-18 DIAGNOSIS — E039 Hypothyroidism, unspecified: Secondary | ICD-10-CM | POA: Insufficient documentation

## 2015-12-18 NOTE — ED Notes (Signed)
[facility] called  

## 2015-12-18 NOTE — ED Notes (Signed)
Bed: RESB Expected date:  Expected time:  Means of arrival:  Comments: EMS 82 yo female from SNF-found on floor sitting against wall/no injuries

## 2015-12-18 NOTE — ED Provider Notes (Signed)
CSN: [id number]     Arrival date & time 12/18/15  0547 History   First MD Initiated Contact with Patient 12/18/15 [phone]     Chief Complaint  Patient presents with  . Fall   Level 5 caveat due to dementia  (Consider location/radiation/quality/duration/timing/severity/associated sxs/prior Treatment) HPI  Cassidy Paul is a 82 y.o. female who has a hx of dementia, hyperlipidemia, DM, and thyroid disease presents by EMS to the Emergency Department complaining of a fall that occurred this evening. According to [facility] nursing home [facility], pt had an unwitnessed fall and was found sitting up on the floor.  Patient does not remember any fall. There are no injuries and patient does not complain of pain.    Past Medical History  Diagnosis Date  . Arthritis   . Thyroid disease   . Hyperlipemia   . Diabetes mellitus without complication (HCC)   . Alzheimer disease   . Abnormal gait   . Depression    Past Surgical History  Procedure Laterality Date  . Cholecystectomy     No family history on file. Social History  Substance Use Topics  . Smoking status: Never Smoker   . Smokeless tobacco: Never Used  . Alcohol Use: No   OB History    No data available     Review of Systems  Unable to perform ROS: Dementia      Allergies  Review of patient's allergies indicates no known allergies.  Home Medications   Prior to Admission medications   Medication Sig Start Date End Date Taking? Authorizing Provider  acetaminophen (TYLENOL) 650 MG CR tablet Take 650 mg by mouth every 6 (six) hours as needed for pain.    Yes Historical Provider, MD  cholecalciferol (VITAMIN D) 400 UNITS TABS tablet Take 800 Units by mouth daily.   Yes Historical Provider, MD  donepezil (ARICEPT) 10 MG tablet Take 10 mg by mouth at bedtime.   Yes Historical Provider, MD  furosemide (LASIX) 20 MG tablet Take 20 mg by mouth daily.   Yes Historical Provider, MD  glipiZIDE (GLUCOTROL) 10 MG tablet  Take 10 mg by mouth 2 (two) times daily.   Yes Historical Provider, MD  levothyroxine (SYNTHROID, LEVOTHROID) 50 MCG tablet Take 50 mcg by mouth daily before breakfast.    Yes Historical Provider, MD  lisinopril (PRINIVIL,ZESTRIL) 2.5 MG tablet Take 2.5 mg by mouth daily.    Yes Historical Provider, MD  loratadine (CLARITIN) 10 MG tablet Take 10 mg by mouth daily as needed for allergies.   Yes Historical Provider, MD  metFORMIN (GLUCOPHAGE) 500 MG tablet Take 500 mg by mouth 2 (two) times daily.   Yes Historical Provider, MD  potassium chloride (K-DUR) 10 MEQ tablet Take 20 mEq by mouth daily.   Yes Historical Provider, MD  protein supplement (RESOURCE BENEPROTEIN) POWD Take 2 scoop by mouth 3 (three) times daily with meals. In 4 ounces of water.   Yes Historical Provider, MD   BP 123/62 mmHg  Pulse 90  Temp(Src) 98.5 F (36.9 C) (Oral)  Resp 18  SpO2 95% Physical Exam  Constitutional: She is oriented to person, place, and time. She appears well-developed and well-nourished. No distress.  HENT:  Head: Normocephalic and atraumatic.  Nose: Nose normal.  Mouth/Throat: Oropharynx is clear and moist. No oropharyngeal exudate.  No sign of head trauma  Eyes: Conjunctivae and EOM are normal. Pupils are equal, round, and reactive to light. No scleral icterus.  Neck: Normal range  of motion. Neck supple. No JVD present. No tracheal deviation present. No thyromegaly present.  Cardiovascular: Normal rate, regular rhythm and normal heart sounds.  Exam reveals no gallop and no friction rub.   No murmur heard. Pulmonary/Chest: Effort normal and breath sounds normal. No respiratory distress. She has no wheezes. She exhibits no tenderness.  Abdominal: Soft. Bowel sounds are normal. She exhibits no distension and no mass. There is no tenderness. There is no rebound and no guarding.  Musculoskeletal: Normal range of motion. She exhibits no edema or tenderness.  Lymphadenopathy:    She has no cervical  adenopathy.  Neurological: She is alert and oriented to person, place, and time. No cranial nerve deficit. She exhibits normal muscle tone.  Normal strength and sensation in all extremities, normal cerebellar testing.  Skin: Skin is warm and dry. No rash noted. No erythema. No pallor.  Nursing note and vitals reviewed.   ED Course  Procedures (including critical care time) Labs Review Labs Reviewed - No data to display  Imaging Review Ct Head Wo Contrast  12/18/2015  CLINICAL DATA:  Possible fall. Previous history of falls. Alzheimer's. Diabetes. EXAM: CT HEAD WITHOUT CONTRAST TECHNIQUE: Contiguous axial images were obtained from the base of the skull through the vertex without intravenous contrast. COMPARISON:  11/28/2015 FINDINGS: Diffuse cerebral atrophy. Ventricular dilatation consistent with central atrophy. Low-attenuation changes in the deep white matter consistent with small vessel ischemia. No mass effect or midline shift. No abnormal extra-axial fluid collections. Gray-white matter junctions are distinct. Basal cisterns are not effaced. No evidence of acute intracranial hemorrhage. No depressed skull fractures. Visualized paranasal sinuses and mastoid air cells are not opacified. Vascular calcifications. IMPRESSION: No acute intracranial abnormalities. Chronic atrophy and small vessel ischemic changes. Electronically Signed   By: Burman Nieves M.D.   On: 12/18/2015 06:20   I have personally reviewed and evaluated these images and lab results as part of my medical decision-making.   EKG Interpretation None      MDM   Final diagnoses:  Fall, initial encounter   Patient presents to the ED after being found on the ground.  She may not have even fallen.  She denies a fall.  CT head is normal.  She is completely asymptomatic and has no complaints currently.  She appears well and in NAD.  VS remain within her normal limits and she is safe for DC   Tomasita Crumble, MD 12/18/15 0630

## 2015-12-18 NOTE — Discharge Instructions (Signed)
Fall Prevention in the Home  Cassidy Paul, your CT scan did not show any injuries. See a primary care doctor within 3 days for close follow up. If symptoms worsen, come back to the ED immediately. Thank you. Falls can cause injuries. They can happen to people of all ages. There are many things you can do to make your home safe and to help prevent falls.  WHAT CAN I DO ON THE OUTSIDE OF MY HOME?  Regularly fix the edges of walkways and driveways and fix any cracks.  Remove anything that might make you trip as you walk through a door, such as a raised step or threshold.  Trim any bushes or trees on the path to your home.  Use bright outdoor lighting.  Clear any walking paths of anything that might make someone trip, such as rocks or tools.  Regularly check to see if handrails are loose or broken. Make sure that both sides of any steps have handrails.  Any raised decks and porches should have guardrails on the edges.  Have any leaves, snow, or ice cleared regularly.  Use sand or salt on walking paths during winter.  Clean up any spills in your garage right away. This includes oil or grease spills. WHAT CAN I DO IN THE BATHROOM?   Use night lights.  Install grab bars by the toilet and in the tub and shower. Do not use towel bars as grab bars.  Use non-skid mats or decals in the tub or shower.  If you need to sit down in the shower, use a plastic, non-slip stool.  Keep the floor dry. Clean up any water that spills on the floor as soon as it happens.  Remove soap buildup in the tub or shower regularly.  Attach bath mats securely with double-sided non-slip rug tape.  Do not have throw rugs and other things on the floor that can make you trip. WHAT CAN I DO IN THE BEDROOM?  Use night lights.  Make sure that you have a light by your bed that is easy to reach.  Do not use any sheets or blankets that are too big for your bed. They should not hang down onto the floor.  Have a firm  chair that has side arms. You can use this for support while you get dressed.  Do not have throw rugs and other things on the floor that can make you trip. WHAT CAN I DO IN THE KITCHEN?  Clean up any spills right away.  Avoid walking on wet floors.  Keep items that you use a lot in easy-to-reach places.  If you need to reach something above you, use a strong step stool that has a grab bar.  Keep electrical cords out of the way.  Do not use floor polish or wax that makes floors slippery. If you must use wax, use non-skid floor wax.  Do not have throw rugs and other things on the floor that can make you trip. WHAT CAN I DO WITH MY STAIRS?  Do not leave any items on the stairs.  Make sure that there are handrails on both sides of the stairs and use them. Fix handrails that are broken or loose. Make sure that handrails are as long as the stairways.  Check any carpeting to make sure that it is firmly attached to the stairs. Fix any carpet that is loose or worn.  Avoid having throw rugs at the top or bottom of the stairs. If you  do have throw rugs, attach them to the floor with carpet tape.  Make sure that you have a light switch at the top of the stairs and the bottom of the stairs. If you do not have them, ask someone to add them for you. WHAT ELSE CAN I DO TO HELP PREVENT FALLS?  Wear shoes that:  Do not have high heels.  Have rubber bottoms.  Are comfortable and fit you well.  Are closed at the toe. Do not wear sandals.  If you use a stepladder:  Make sure that it is fully opened. Do not climb a closed stepladder.  Make sure that both sides of the stepladder are locked into place.  Ask someone to hold it for you, if possible.  Clearly mark and make sure that you can see:  Any grab bars or handrails.  First and last steps.  Where the edge of each step is.  Use tools that help you move around (mobility aids) if they are needed. These  include:  Canes.  Walkers.  Scooters.  Crutches.  Turn on the lights when you go into a dark area. Replace any light bulbs as soon as they burn out.  Set up your furniture so you have a clear path. Avoid moving your furniture around.  If any of your floors are uneven, fix them.  If there are any pets around you, be aware of where they are.  Review your medicines with your doctor. Some medicines can make you feel dizzy. This can increase your chance of falling. Ask your doctor what other things that you can do to help prevent falls.   This information is not intended to replace advice given to you by your health care provider. Make sure you discuss any questions you have with your health care provider.   Document Released: 07/24/2009 Document Revised: 02/11/2015 Document Reviewed: 11/01/2014 Elsevier Interactive Patient Education [employer]2016 [employer]vier Inc.

## 2015-12-18 NOTE — ED Notes (Signed)
[facility] B[facility]. Staff states they found pt sitting in the floor up against the wall with her blankets folder around her. Pt has a hx of alzheimer's and falls. Pt has no obvious injuries and denies pain. Pt is not a reliable historian.

## 2016-10-25 ENCOUNTER — Encounter (HOSPITAL_COMMUNITY): Payer: Self-pay | Admitting: Family Medicine

## 2016-10-25 ENCOUNTER — Inpatient Hospital Stay (HOSPITAL_COMMUNITY)
Admission: EM | Admit: 2016-10-25 | Discharge: 2016-10-28 | DRG: 682 | Disposition: A | Discharge status: Skilled Nursing Facility | Payer: Medicare Other | Attending: Internal Medicine | Admitting: Internal Medicine

## 2016-10-25 ENCOUNTER — Inpatient Hospital Stay (HOSPITAL_COMMUNITY): Payer: Medicare Other

## 2016-10-25 ENCOUNTER — Emergency Department (HOSPITAL_COMMUNITY): Payer: Medicare Other

## 2016-10-25 DIAGNOSIS — E875 Hyperkalemia: Secondary | ICD-10-CM | POA: Diagnosis present

## 2016-10-25 DIAGNOSIS — G309 Alzheimer's disease, unspecified: Secondary | ICD-10-CM | POA: Diagnosis present

## 2016-10-25 DIAGNOSIS — R52 Pain, unspecified: Secondary | ICD-10-CM

## 2016-10-25 DIAGNOSIS — N179 Acute kidney failure, unspecified: Principal | ICD-10-CM | POA: Diagnosis present

## 2016-10-25 DIAGNOSIS — R4182 Altered mental status, unspecified: Secondary | ICD-10-CM | POA: Diagnosis not present

## 2016-10-25 DIAGNOSIS — Z7984 Long term (current) use of oral hypoglycemic drugs: Secondary | ICD-10-CM | POA: Diagnosis not present

## 2016-10-25 DIAGNOSIS — R1310 Dysphagia, unspecified: Secondary | ICD-10-CM | POA: Diagnosis present

## 2016-10-25 DIAGNOSIS — N183 Chronic kidney disease, stage 3 (moderate): Secondary | ICD-10-CM | POA: Diagnosis present

## 2016-10-25 DIAGNOSIS — E870 Hyperosmolality and hypernatremia: Secondary | ICD-10-CM | POA: Diagnosis present

## 2016-10-25 DIAGNOSIS — Z515 Encounter for palliative care: Secondary | ICD-10-CM | POA: Diagnosis not present

## 2016-10-25 DIAGNOSIS — E1122 Type 2 diabetes mellitus with diabetic chronic kidney disease: Secondary | ICD-10-CM | POA: Diagnosis present

## 2016-10-25 DIAGNOSIS — I509 Heart failure, unspecified: Secondary | ICD-10-CM | POA: Diagnosis present

## 2016-10-25 DIAGNOSIS — F0280 Dementia in other diseases classified elsewhere without behavioral disturbance: Secondary | ICD-10-CM | POA: Diagnosis present

## 2016-10-25 DIAGNOSIS — G9340 Encephalopathy, unspecified: Secondary | ICD-10-CM | POA: Diagnosis not present

## 2016-10-25 DIAGNOSIS — Z7401 Bed confinement status: Secondary | ICD-10-CM | POA: Diagnosis not present

## 2016-10-25 DIAGNOSIS — G9341 Metabolic encephalopathy: Secondary | ICD-10-CM | POA: Diagnosis present

## 2016-10-25 DIAGNOSIS — E785 Hyperlipidemia, unspecified: Secondary | ICD-10-CM | POA: Diagnosis present

## 2016-10-25 DIAGNOSIS — Z7189 Other specified counseling: Secondary | ICD-10-CM | POA: Diagnosis not present

## 2016-10-25 DIAGNOSIS — E860 Dehydration: Secondary | ICD-10-CM | POA: Diagnosis present

## 2016-10-25 DIAGNOSIS — E119 Type 2 diabetes mellitus without complications: Secondary | ICD-10-CM

## 2016-10-25 DIAGNOSIS — Z681 Body mass index (BMI) 19 or less, adult: Secondary | ICD-10-CM

## 2016-10-25 DIAGNOSIS — Z66 Do not resuscitate: Secondary | ICD-10-CM | POA: Diagnosis present

## 2016-10-25 DIAGNOSIS — E43 Unspecified severe protein-calorie malnutrition: Secondary | ICD-10-CM | POA: Insufficient documentation

## 2016-10-25 LAB — URINALYSIS, ROUTINE W REFLEX MICROSCOPIC
Bilirubin Urine: NEGATIVE
Glucose, UA: NEGATIVE mg/dL
Hgb urine dipstick: NEGATIVE
Ketones, ur: NEGATIVE mg/dL
LEUKOCYTES UA: NEGATIVE
NITRITE: NEGATIVE
PROTEIN: NEGATIVE mg/dL
SPECIFIC GRAVITY, URINE: 1.012 (ref 1.005–1.030)
pH: 5 (ref 5.0–8.0)

## 2016-10-25 LAB — CBC
HCT: 35.7 % — ABNORMAL LOW (ref 36.0–46.0)
Hemoglobin: 11.2 g/dL — ABNORMAL LOW (ref 12.0–15.0)
MCH: 26.6 pg (ref 26.0–34.0)
MCHC: 31.4 g/dL (ref 30.0–36.0)
MCV: 84.8 fL (ref 78.0–100.0)
PLATELETS: 162 10*3/uL (ref 150–400)
RBC: 4.21 MIL/uL (ref 3.87–5.11)
RDW: 16.2 % — AB (ref 11.5–15.5)
WBC: 8.4 10*3/uL (ref 4.0–10.5)

## 2016-10-25 LAB — CREATININE, SERUM
Creatinine, Ser: 2.34 mg/dL — ABNORMAL HIGH (ref 0.44–1.00)
GFR calc Af Amer: 20 mL/min — ABNORMAL LOW (ref >60)
GFR calc non Af Amer: 17 mL/min — ABNORMAL LOW (ref >60)

## 2016-10-25 LAB — CBC WITH DIFFERENTIAL/PLATELET
BASOS ABS: 0 10*3/uL (ref 0.0–0.1)
BASOS PCT: 0 %
EOS ABS: 0 10*3/uL (ref 0.0–0.7)
Eosinophils Relative: 0 %
HCT: 41.7 % (ref 36.0–46.0)
Hemoglobin: 12.9 g/dL (ref 12.0–15.0)
Lymphocytes Relative: 16 %
Lymphs Abs: 1.5 10*3/uL (ref 0.7–4.0)
MCH: 26.2 pg (ref 26.0–34.0)
MCHC: 30.9 g/dL (ref 30.0–36.0)
MCV: 84.6 fL (ref 78.0–100.0)
Monocytes Absolute: 0.4 10*3/uL (ref 0.1–1.0)
Monocytes Relative: 4 %
Neutro Abs: 7.4 10*3/uL (ref 1.7–7.7)
Neutrophils Relative %: 80 %
Platelets: 180 10*3/uL (ref 150–400)
RBC: 4.93 MIL/uL (ref 3.87–5.11)
RDW: 16.4 % — AB (ref 11.5–15.5)
WBC: 9.4 10*3/uL (ref 4.0–10.5)

## 2016-10-25 LAB — COMPREHENSIVE METABOLIC PANEL
ALBUMIN: 3.2 g/dL — AB (ref 3.5–5.0)
ALT: 65 U/L — ABNORMAL HIGH (ref 14–54)
ANION GAP: 11 (ref 5–15)
AST: 27 U/L (ref 15–41)
Alkaline Phosphatase: 59 U/L (ref 38–126)
BUN: 57 mg/dL — ABNORMAL HIGH (ref 6–20)
CO2: 22 mmol/L (ref 22–32)
Calcium: 9.9 mg/dL (ref 8.9–10.3)
Chloride: 114 mmol/L — ABNORMAL HIGH (ref 101–111)
Creatinine, Ser: 2.51 mg/dL — ABNORMAL HIGH (ref 0.44–1.00)
GFR calc Af Amer: 18 mL/min — ABNORMAL LOW (ref >60)
GFR, EST NON AFRICAN AMERICAN: 16 mL/min — AB (ref >60)
GLUCOSE: 169 mg/dL — AB (ref 65–99)
POTASSIUM: 5.6 mmol/L — AB (ref 3.5–5.1)
Sodium: 147 mmol/L — ABNORMAL HIGH (ref 135–145)
Total Bilirubin: 0.7 mg/dL (ref 0.3–1.2)
Total Protein: 6.4 g/dL — ABNORMAL LOW (ref 6.5–8.1)

## 2016-10-25 LAB — TROPONIN I: Troponin I: <0.03 ng/mL (ref <0.03)

## 2016-10-25 MED ORDER — DONEPEZIL HCL 10 MG PO TABS
10.0000 mg | ORAL_TABLET | Freq: Every day | ORAL | Status: DC
  Administered 2016-10-26 – 2016-10-28 (×3): 10 mg via ORAL
  Filled 2016-10-25 (×4): qty 1

## 2016-10-25 MED ORDER — MIRTAZAPINE 15 MG PO TABS
7.5000 mg | ORAL_TABLET | Freq: Every day | ORAL | Status: DC
  Administered 2016-10-26 – 2016-10-28 (×3): 7.5 mg via ORAL
  Filled 2016-10-25 (×4): qty 1

## 2016-10-25 MED ORDER — LORATADINE 10 MG PO TABS: 10.0000 mg | ORAL_TABLET | Freq: Every day | ORAL | Status: DC | PRN

## 2016-10-25 MED ORDER — MEGESTROL ACETATE 400 MG/10ML PO SUSP
400.0000 mg | Freq: Two times a day (BID) | ORAL | Status: DC
  Administered 2016-10-26 – 2016-10-28 (×6): 400 mg via ORAL
  Filled 2016-10-25 (×7): qty 10

## 2016-10-25 MED ORDER — INSULIN ASPART 100 UNIT/ML ~~LOC~~ SOLN
0.0000 [IU] | Freq: Three times a day (TID) | SUBCUTANEOUS | Status: DC
  Administered 2016-10-26: 1 [IU] via SUBCUTANEOUS
  Administered 2016-10-27: 3 [IU] via SUBCUTANEOUS
  Administered 2016-10-27: 1 [IU] via SUBCUTANEOUS
  Administered 2016-10-28: 2 [IU] via SUBCUTANEOUS

## 2016-10-25 MED ORDER — ENOXAPARIN SODIUM 40 MG/0.4ML ~~LOC~~ SOLN
40.0000 mg | SUBCUTANEOUS | Status: DC
  Administered 2016-10-26: 40 mg via SUBCUTANEOUS
  Filled 2016-10-25: qty 0.4

## 2016-10-25 MED ORDER — ACETAMINOPHEN 325 MG PO TABS: 650.0000 mg | ORAL_TABLET | Freq: Four times a day (QID) | ORAL | Status: DC | PRN

## 2016-10-25 MED ORDER — FEBUXOSTAT 40 MG PO TABS
40.0000 mg | ORAL_TABLET | Freq: Every day | ORAL | Status: DC
  Administered 2016-10-26 – 2016-10-28 (×3): 40 mg via ORAL
  Filled 2016-10-25 (×3): qty 1

## 2016-10-25 MED ORDER — LEVOTHYROXINE SODIUM 50 MCG PO TABS
50.0000 ug | ORAL_TABLET | Freq: Every day | ORAL | Status: DC
Start: 2016-10-26 — End: 2016-10-28
  Administered 2016-10-26 – 2016-10-28 (×3): 50 ug via ORAL
  Filled 2016-10-25 (×3): qty 1

## 2016-10-25 MED ORDER — SODIUM CHLORIDE 0.9 % IV SOLN: 1000.0000 mL | INTRAVENOUS | Status: DC

## 2016-10-25 MED ORDER — SODIUM CHLORIDE 0.9 % IV SOLN
1000.0000 mL | Freq: Once | INTRAVENOUS | Status: AC
  Administered 2016-10-25: 1000 mL via INTRAVENOUS

## 2016-10-25 MED ORDER — SODIUM CHLORIDE 0.45 % IV SOLN
INTRAVENOUS | Status: DC
  Administered 2016-10-25: 19:00:00 via INTRAVENOUS
  Administered 2016-10-26: 75 mL via INTRAVENOUS
  Administered 2016-10-26: 07:00:00 via INTRAVENOUS
  Administered 2016-10-28: 1000 mL via INTRAVENOUS

## 2016-10-25 NOTE — ED Notes (Addendum)
Please contact Cassidy Paul at [phone] (H), [phone] (C) with room assignment

## 2016-10-25 NOTE — ED Notes (Addendum)
Called son, Billee CashingRicky Gillie, to update him on pt's room assignment.

## 2016-10-25 NOTE — ED Provider Notes (Addendum)
[facility]-EMERGENCY DEPT Provider Note   CSN: [id number] Arrival date & time: 10/25/16  1324     History   Chief Complaint Chief Complaint  Patient presents with  . Altered Mental Status   Level V caveat: Altered mental status/dementia  HPI Cassidy Paul is a 83 y.o. female.  HPI Patient is a 83 year old who comes to us from a nursing home with a history of Alzheimer's dementia.  She also has a history of diabetes, hyperlipidemia, thyroid disease and presents with increasing confusion and altered mental status today.  Son reports that he saw her 3 days ago and she seems somewhat more tired at that time.  She was sent here for possible right-sided facial droop and concern for stroke.  Patient reports no pain anywhere.  Patient reports that she prefers to sleep.  Son reports that he does not notices significant facial droop but that she's had significant decreased intake both with liquids and solids over the past several days.   Past Medical History:  Diagnosis Date  . Abnormal gait   . Alzheimer disease   . Arthritis   . Depression   . Diabetes mellitus without complication (HCC)   . Hyperlipemia   . Thyroid disease     Patient Active Problem List   Diagnosis Date Noted  . Alzheimer disease   . Abnormal gait     Past Surgical History:  Procedure Laterality Date  . CHOLECYSTECTOMY      OB History    No data available       Home Medications    Prior to Admission medications   Medication Sig Start Date End Date Taking? Authorizing Provider  acetaminophen (TYLENOL) 650 MG CR tablet Take 650 mg by mouth every 6 (six) hours as needed for pain.    Yes Historical Provider, MD  febuxostat (ULORIC) 40 MG tablet Take 40 mg by mouth daily.   Yes Historical Provider, MD  furosemide (LASIX) 20 MG tablet Take 20 mg by mouth daily.   Yes Historical Provider, MD  levothyroxine (SYNTHROID, LEVOTHROID) 50 MCG tablet Take 50 mcg by mouth daily before breakfast.    Yes Historical  Provider, MD  lisinopril (PRINIVIL,ZESTRIL) 2.5 MG tablet Take 2.5 mg by mouth daily.    Yes Historical Provider, MD  loratadine (CLARITIN) 10 MG tablet Take 10 mg by mouth daily as needed for allergies.   Yes Historical Provider, MD  megestrol (MEGACE) 400 MG/10ML suspension Take 400 mg by mouth 2 (two) times daily.   Yes Historical Provider, MD  metFORMIN (GLUCOPHAGE) 500 MG tablet Take 500 mg by mouth 2 (two) times daily.   Yes Historical Provider, MD  mirtazapine (REMERON) 15 MG tablet Take 7.5 mg by mouth at bedtime.   Yes Historical Provider, MD  potassium chloride (K-DUR) 10 MEQ tablet Take 20 mEq by mouth daily.   Yes Historical Provider, MD  cholecalciferol (VITAMIN D) 400 UNITS TABS tablet Take 800 Units by mouth daily.    Historical Provider, MD  donepezil (ARICEPT) 10 MG tablet Take 10 mg by mouth at bedtime.    Historical Provider, MD  glipiZIDE (GLUCOTROL) 10 MG tablet Take 10 mg by mouth 2 (two) times daily.    Historical Provider, MD  protein supplement (RESOURCE BENEPROTEIN) POWD Take 2 scoop by mouth 3 (three) times daily with meals. In 4 ounces of water.    Historical Provider, MD    Family History No family history on file.  Social History Social History  Substance Use Topics  .  Smoking status: Never Smoker  . Smokeless tobacco: Never Used  . Alcohol use No     Allergies   Patient has no known allergies.   Review of Systems Review of Systems  Unable to perform ROS: Dementia     Physical Exam Updated Vital Signs BP (!) 117/54   Pulse 67   Temp 98.9 F (37.2 C) (Oral)   Resp 16   SpO2 100%   Physical Exam  Constitutional: She appears well-developed and well-nourished. No distress.  HENT:  Head: Normocephalic and atraumatic.  Eyes: EOM are normal.  Neck: Normal range of motion.  Cardiovascular: Normal rate and regular rhythm.   Pulmonary/Chest: Effort normal and breath sounds normal.  Abdominal: Soft. She exhibits no distension. There is no  tenderness.  Musculoskeletal: Normal range of motion.  Neurological: She is alert.  Equal grip bilaterally.  Wiggles toes bilaterally.  Answers simple questions.  Follows simple commands  Skin: Skin is warm and dry.  Psychiatric: She has a normal mood and affect. Judgment normal.  Nursing note and vitals reviewed.    ED Treatments / Results  Labs (all labs ordered are listed, but only abnormal results are displayed) Labs Reviewed  CBC WITH DIFFERENTIAL/PLATELET - Abnormal; Notable for the following:       Result Value   RDW 16.4 (*)    All other components within normal limits  COMPREHENSIVE METABOLIC PANEL - Abnormal; Notable for the following:    Sodium 147 (*)    Potassium 5.6 (*)    Chloride 114 (*)    Glucose, Bld 169 (*)    BUN 57 (*)    Creatinine, Ser 2.51 (*)    Total Protein 6.4 (*)    Albumin 3.2 (*)    ALT 65 (*)    GFR calc non Af Amer 16 (*)    GFR calc Af Amer 18 (*)    All other components within normal limits  TROPONIN I  URINALYSIS, ROUTINE W REFLEX MICROSCOPIC   BUN  Date Value Ref Range Status  10/25/2016 57 (H) 6 - 20 mg/dL Final  78/46/9629 38 (H) 6 - 20 mg/dL Final  52/84/1324 25 (H) 6 - 23 mg/dL Final   Creatinine, Ser  Date Value Ref Range Status  10/25/2016 2.51 (H) 0.44 - 1.00 mg/dL Final  40/07/2724 3.66 (H) 0.44 - 1.00 mg/dL Final  44/12/4740 5.95 0.50 - 1.10 mg/dL Final      EKG  EKG Interpretation  Date/Time:  Monday October 25 2016 13:36:20 EST Ventricular Rate:  71 PR Interval:    QRS Duration: 101 QT Interval:  395 QTC Calculation: 430 R Axis:   68 Text Interpretation:  Sinus rhythm Short PR interval No significant change was found Confirmed by Rollen Selders  MD, Caryn Bee ([id number]) on 10/25/2016 5:17:32 PM       Radiology Ct Head Wo Contrast  Result Date: 10/25/2016 CLINICAL DATA:  83 year old female with altered mental status. Initial encounter. EXAM: CT HEAD WITHOUT CONTRAST TECHNIQUE: Contiguous axial images were obtained from  the base of the skull through the vertex without intravenous contrast. COMPARISON:  12/18/2015 and earlier. FINDINGS: Brain: Stable cerebral volume. Stable lateral and third ventricular prominence. No midline shift, mass effect, or evidence of intracranial mass lesion. No acute intracranial hemorrhage identified. Patchy bilateral cerebral white matter hypodensity is stable. No cortically based acute infarct identified. No cortical encephalomalacia identified. Vascular: Calcified atherosclerosis at the skull base. No suspicious intracranial vascular hyperdensity. Skull: No acute osseous abnormality identified. Sinuses/Orbits: Visualized paranasal  sinuses and mastoids are stable and well pneumatized. Other: No acute orbit or scalp soft tissue findings. IMPRESSION: No acute intracranial abnormality. Stable non contrast CT appearance of the brain. Electronically Signed   By: Odessa Fleming M.D.   On: 10/25/2016 15:26    Procedures Procedures (including critical care time)  Medications Ordered in ED Medications  0.9 %  sodium chloride infusion (not administered)    Followed by  0.9 %  sodium chloride infusion (not administered)     Initial Impression / Assessment and Plan / ED Course  I have reviewed the triage vital signs and the nursing notes.  Pertinent labs & imaging results that were available during my care of the patient were reviewed by me and considered in my medical decision making (see chart for details).  Clinical Course     Worsening renal insufficiency.  Likely dehydration.  This could be the cause of her generalized weakness.  Possibly some confusion.  Head CT without acute pathology.  She may benefit from MRI for further evaluation.  Gentle IV hydration now.  She'll need her creatinine rechecked in the morning.  This may be developing failure to thrive picture and palliative care consultation may be necessary as family reports that she does have what they believe to be a poor quality of  life.  Final Clinical Impressions(s) / ED Diagnoses   Final diagnoses:  Altered mental status, unspecified altered mental status type  AKI (acute kidney injury) [facility])    New Prescriptions New Prescriptions   No medications on file     Azalia Bilis, MD 10/25/16 1723    Azalia Bilis, MD 10/25/16 1723

## 2016-10-25 NOTE — ED Notes (Signed)
Carb modified dinner tray ordered through dietary services.

## 2016-10-25 NOTE — ED Notes (Signed)
Attempted report x1. 

## 2016-10-25 NOTE — H&P (Addendum)
History and Physical    Cassidy Paul:[id number] DOB: 06-06-1922 DOA: 10/25/2016  Referring MD/NP/PA: EDP PCP: Florentina JennyRIPP, HENRY, MD  Patient coming from: SNF  Chief Complaint: Lethargy/more sleepy  HPI: Cassidy Paul is a 83 y.o. female with medical history significant of DM, CHF, CKD3, Dementia, Bed bound, resident of SNF, almost total care was brought to the ER due to the above complaints. Per Staff at SNF more sleepy for last 1-2days and total a CNA felt that she had a mild facial droop and was sent to the ER, her son refutes this and reports that her face is at baseline but she does appears more sleepy and drowsy than usual. No H/o fevers or chills  ED Course: Found to have AKI and hypernatremia, CT head unremarkable, CXR pending  Review of Systems: As per HPI otherwise 10 point review of systems negative.    Past Medical History:  Diagnosis Date  . Abnormal gait   . Alzheimer disease   . Arthritis   . Depression   . Diabetes mellitus without complication (HCC)   . Hyperlipemia   . Thyroid disease     Past Surgical History:  Procedure Laterality Date  . CHOLECYSTECTOMY       reports that she has never smoked. She has never used smokeless tobacco. She reports that she does not drink alcohol or use drugs.  No Known Allergies  Family history -no H/o ESRD, Dementia  Prior to Admission medications   Medication Sig Start Date End Date Taking? Authorizing Provider  acetaminophen (TYLENOL) 650 MG CR tablet Take 650 mg by mouth every 6 (six) hours as needed for pain.    Yes Historical Provider, MD  febuxostat (ULORIC) 40 MG tablet Take 40 mg by mouth daily.   Yes Historical Provider, MD  furosemide (LASIX) 20 MG tablet Take 20 mg by mouth daily.   Yes Historical Provider, MD  levothyroxine (SYNTHROID, LEVOTHROID) 50 MCG tablet Take 50 mcg by mouth daily before breakfast.    Yes Historical Provider, MD  lisinopril (PRINIVIL,ZESTRIL) 2.5 MG tablet Take 2.5 mg by mouth daily.     Yes Historical Provider, MD  loratadine (CLARITIN) 10 MG tablet Take 10 mg by mouth daily as needed for allergies.   Yes Historical Provider, MD  megestrol (MEGACE) 400 MG/10ML suspension Take 400 mg by mouth 2 (two) times daily.   Yes Historical Provider, MD  metFORMIN (GLUCOPHAGE) 500 MG tablet Take 500 mg by mouth 2 (two) times daily.   Yes Historical Provider, MD  mirtazapine (REMERON) 15 MG tablet Take 7.5 mg by mouth at bedtime.   Yes Historical Provider, MD  potassium chloride (K-DUR) 10 MEQ tablet Take 20 mEq by mouth daily.   Yes Historical Provider, MD  cholecalciferol (VITAMIN D) 400 UNITS TABS tablet Take 800 Units by mouth daily.    Historical Provider, MD  donepezil (ARICEPT) 10 MG tablet Take 10 mg by mouth at bedtime.    Historical Provider, MD  glipiZIDE (GLUCOTROL) 10 MG tablet Take 10 mg by mouth 2 (two) times daily.    Historical Provider, MD  protein supplement (RESOURCE BENEPROTEIN) POWD Take 2 scoop by mouth 3 (three) times daily with meals. In 4 ounces of water.    Historical Provider, MD    Physical Exam: Vitals:   10/25/16 1630 10/25/16 1645 10/25/16 1700 10/25/16 1715  BP: 109/65 144/59 102/79 108/73  Pulse: 83 84 89 76  Resp: (!) 27 19 15 14   Temp:  TempSrc:      SpO2: 99% 97% 97% 95%       Vitals:   10/25/16 1630 10/25/16 1645 10/25/16 1700 10/25/16 1715  BP: 109/65 144/59 102/79 108/73  Pulse: 83 84 89 76  Resp: (!) 27 19 15 14   Temp:      TempSrc:      SpO2: 99% 97% 97% 95%   Gen: Drowsy, frail, elderly cachectic, opens eyes, says few words, somewhat confused abt details , oriented to self only Eyes: PERRL, lids and conjunctivae normal ENMT: poor dental hygiene,  Neck: normal, supple, no masses, no thyromegaly Respiratory: decreased BS at bases Cardiovascular: Regular rate and rhythm, no murmurs Abdomen: soft, NT,  Bowel sounds positive.  Musculoskeletal: frail, malnourished Skin: no rashes, lesions, ulcers. No induration Neurologic:  drowsy, no facial droop, moves all extremities to command, no localising signs  Psychiatric: unable to assess   Labs on Admission: I have personally reviewed following labs and imaging studies  CBC:  Recent Labs Lab 10/25/16 1517  WBC 9.4  NEUTROABS 7.4  HGB 12.9  HCT 41.7  MCV 84.6  PLT 180   Basic Metabolic Panel:  Recent Labs Lab 10/25/16 1517  NA 147*  K 5.6*  CL 114*  CO2 22  GLUCOSE 169*  BUN 57*  CREATININE 2.51*  CALCIUM 9.9   GFR: CrCl cannot be calculated (Unknown ideal weight.). Liver Function Tests:  Recent Labs Lab 10/25/16 1517  AST 27  ALT 65*  ALKPHOS 59  BILITOT 0.7  PROT 6.4*  ALBUMIN 3.2*   No results for input(s): LIPASE, AMYLASE in the last 168 hours. No results for input(s): AMMONIA in the last 168 hours. Coagulation Profile: No results for input(s): INR, PROTIME in the last 168 hours. Cardiac Enzymes:  Recent Labs Lab 10/25/16 1517  TROPONINI <0.03   BNP (last 3 results) No results for input(s): PROBNP in the last 8760 hours. HbA1C: No results for input(s): HGBA1C in the last 72 hours. CBG: No results for input(s): GLUCAP in the last 168 hours. Lipid Profile: No results for input(s): CHOL, HDL, LDLCALC, TRIG, CHOLHDL, LDLDIRECT in the last 72 hours. Thyroid Function Tests: No results for input(s): TSH, T4TOTAL, FREET4, T3FREE, THYROIDAB in the last 72 hours. Anemia Panel: No results for input(s): VITAMINB12, FOLATE, FERRITIN, TIBC, IRON, RETICCTPCT in the last 72 hours. Urine analysis:    Component Value Date/Time   COLORURINE YELLOW 10/25/2016 1549   APPEARANCEUR CLEAR 10/25/2016 1549   LABSPEC 1.012 10/25/2016 1549   PHURINE 5.0 10/25/2016 1549   GLUCOSEU NEGATIVE 10/25/2016 1549   HGBUR NEGATIVE 10/25/2016 1549   BILIRUBINUR NEGATIVE 10/25/2016 1549   KETONESUR NEGATIVE 10/25/2016 1549   PROTEINUR NEGATIVE 10/25/2016 1549   UROBILINOGEN 0.2 06/25/2007 1145   NITRITE NEGATIVE 10/25/2016 1549   LEUKOCYTESUR  NEGATIVE 10/25/2016 1549   Sepsis Labs: @LABRCNTIP (procalcitonin:4,lacticidven:4) )No results found for this or any previous visit (from the past 240 hour(s)).   Radiological Exams on Admission: Ct Head Wo Contrast  Result Date: 10/25/2016 CLINICAL DATA:  83 year old female with altered mental status. Initial encounter. EXAM: CT HEAD WITHOUT CONTRAST TECHNIQUE: Contiguous axial images were obtained from the base of the skull through the vertex without intravenous contrast. COMPARISON:  12/18/2015 and earlier. FINDINGS: Brain: Stable cerebral volume. Stable lateral and third ventricular prominence. No midline shift, mass effect, or evidence of intracranial mass lesion. No acute intracranial hemorrhage identified. Patchy bilateral cerebral white matter hypodensity is stable. No cortically based acute infarct identified. No cortical encephalomalacia identified. Vascular:  Calcified atherosclerosis at the skull base. No suspicious intracranial vascular hyperdensity. Skull: No acute osseous abnormality identified. Sinuses/Orbits: Visualized paranasal sinuses and mastoids are stable and well pneumatized. Other: No acute orbit or scalp soft tissue findings. IMPRESSION: No acute intracranial abnormality. Stable non contrast CT appearance of the brain. Electronically Signed   By: Odessa Fleming M.D.   On: 10/25/2016 15:26    EKG: Independently reviewed. NSR Assessment/Plan Principal Problem:   AKI (acute kidney injury) (HCC)\ -likely prerenal from poor Po intake -creatinine up to 2.5 from baseline of 1.2 with hypernatremia and hyperkalemia -hydrate with 1/2 NS -monitor urine output -hold ACE and lasix  Acute metabolic encephalopathy -due to AKI/hyperntremia with baseline Dementia -on exam no facial droop and son agrees with this -CT head unremarkable -unless new or worsening mentation no plans for MRI-d/w son he agrees -CXR pending will FU -UA unremarkable   Alzheimer disease -total care, SNF resident,  discussed poor prognosis and code status and son agrees to DNR -consider Palliative consult in hospital if fails to respond to medical Rx -definitively needs Palliative eval at SNF if not done inpatient    H/o CHF (congestive heart failure) (HCC) -appears dry, hydrate, hold lasix , monitor -no ECHO in our system    Diabetes mellitus (HCC) -hold metformin, SSI  DVT prophylaxis: lovenox Code Status: DNR, d/w son Arlyss Repress Family Communication: called and d/w son Arlyss Repress Disposition Plan: inpatient Consults called: none Admission status: inpatient   Zannie Cove MD [facility] Pager [phone]  If 7PM-7AM, please contact night-coverage [email or web address] Password [facility]  10/25/2016, 5:45 PM

## 2016-10-25 NOTE — ED Notes (Signed)
Dinner tray has not yet been received. Pt diet cancelled and pt to be NPO. Will notify dietary.

## 2016-10-25 NOTE — ED Triage Notes (Signed)
[facility] with c/o left sided facial droop and decreased LOC. Pt was LSN yesterday at 1500, facial droop and decreased LOC deiscovered at 0700 today, EMS was called and upon arrival found that pt had been fed this morning and had food in her mouth - this was suctioned out but SpO2 was 77% RA - pt placed on 6L [place] and O2 now 98%. Pt is arousable to verbal stimuli but non-verbal and withdraws from pain.  Pt has Alzheimer's dementia, T2DM.

## 2016-10-26 ENCOUNTER — Inpatient Hospital Stay (HOSPITAL_COMMUNITY): Payer: Medicare Other

## 2016-10-26 LAB — GLUCOSE, CAPILLARY
GLUCOSE-CAPILLARY: 124 mg/dL — AB (ref 65–99)
GLUCOSE-CAPILLARY: 113 mg/dL — AB (ref 65–99)
GLUCOSE-CAPILLARY: 99 mg/dL (ref 65–99)
Glucose-Capillary: 108 mg/dL — ABNORMAL HIGH (ref 65–99)

## 2016-10-26 LAB — BASIC METABOLIC PANEL
ANION GAP: 9 (ref 5–15)
BUN: 51 mg/dL — ABNORMAL HIGH (ref 6–20)
CO2: 22 mmol/L (ref 22–32)
CREATININE: 2.34 mg/dL — AB (ref 0.44–1.00)
Calcium: 8.9 mg/dL (ref 8.9–10.3)
Chloride: 116 mmol/L — ABNORMAL HIGH (ref 101–111)
GFR calc non Af Amer: 17 mL/min — ABNORMAL LOW (ref >60)
GFR, EST AFRICAN AMERICAN: 20 mL/min — AB (ref >60)
Glucose, Bld: 137 mg/dL — ABNORMAL HIGH (ref 65–99)
Potassium: 5.2 mmol/L — ABNORMAL HIGH (ref 3.5–5.1)
SODIUM: 147 mmol/L — AB (ref 135–145)

## 2016-10-26 LAB — CBC
HCT: 34.5 % — ABNORMAL LOW (ref 36.0–46.0)
HEMOGLOBIN: 10.5 g/dL — AB (ref 12.0–15.0)
MCH: 26 pg (ref 26.0–34.0)
MCHC: 30.4 g/dL (ref 30.0–36.0)
MCV: 85.4 fL (ref 78.0–100.0)
Platelets: 178 10*3/uL (ref 150–400)
RBC: 4.04 MIL/uL (ref 3.87–5.11)
RDW: 16.4 % — ABNORMAL HIGH (ref 11.5–15.5)
WBC: 8.2 10*3/uL (ref 4.0–10.5)

## 2016-10-26 LAB — MRSA PCR SCREENING: MRSA BY PCR: NEGATIVE

## 2016-10-26 MED ORDER — ENSURE ENLIVE PO LIQD
237.0000 mL | Freq: Two times a day (BID) | ORAL | Status: DC
  Administered 2016-10-27 – 2016-10-28 (×2): 237 mL via ORAL

## 2016-10-26 MED ORDER — ENOXAPARIN SODIUM 30 MG/0.3ML ~~LOC~~ SOLN
30.0000 mg | SUBCUTANEOUS | Status: DC
  Administered 2016-10-26 – 2016-10-28 (×2): 30 mg via SUBCUTANEOUS
  Filled 2016-10-26 (×2): qty 0.3

## 2016-10-26 NOTE — Progress Notes (Signed)
PROGRESS NOTE    Cassidy VANDERWEELE  ZOX:[id number] DOB: 1922-05-02 DOA: 10/25/2016 PCP: Florentina Jenny, MD Brief Narrative: Cassidy Paul is a 83 y.o. female with medical history significant of DM, CHF, CKD3, Dementia, Bed bound, resident of SNF, almost total care was brought to the ER due to the above complaints. Per Staff at SNF more sleepy for last 1-2days and total a CNA felt that she had a mild facial droop and was sent to the ER, her son refutes this and reports that her face is at baseline but she does appears more sleepy and drowsy than usual. No H/o fevers or chills In ED Found to have AKI and hypernatremia, CT head unremarkable  Assessment & Plan:   AKI (acute kidney injury) (HCC)\ -likely prerenal from poor Po intake -creatinine up to 2.5 from baseline of 1.2 with hypernatremia and hyperkalemia -without much change ovenight, continue 1/2 NS will cut down rate to 75cc/hr -check Renal US -hold ACE and lasix -Bmet in am  Acute metabolic encephalopathy -due to AKI/hyperntremia with baseline Dementia -mild improvement, close to baseline -CT head unremarkable -unless new or worsening mentation no plans for MRI-d/w son he agrees -CXR/UA unremarkable -check SLP eval   Alzheimer disease -total care, SNF resident, discussed poor prognosis and code status and son agrees to DNR -will consider Palliative consult in hospital if fails to improve with medical Rx -definitively needs Palliative eval at SNF if not done inpatient    H/o CHF (congestive heart failure) (HCC) - holding lasix due to AKI , monitor -no ECHO in our system    Diabetes mellitus (HCC) -hold metformin, SSI  DVT prophylaxis: lovenox Code Status: DNR after d/w son [name] Family Communication: called and d/w son [name] 1/15 Disposition Plan: inpatient  Consultants:      Subjective: Feels ok, no complaints, no events overnight  Objective: Vitals:   10/25/16 1831 10/25/16 1832 10/25/16 1932  10/26/16 0549  BP: 145/72  114/60 (!) 101/59  Pulse:  89 80 84  Resp:   18 16  Temp:    98.1 F (36.7 C)  TempSrc:      SpO2:  100% 100% 93%  Weight:   46.6 kg (102 lb 11.2 oz)   Height:   5\' 3"  (1.6 m)    No intake or output data in the 24 hours ending 10/26/16 1024 Filed Weights   10/25/16 1932  Weight: 46.6 kg (102 lb 11.2 oz)    Examination: Gen: Drowsy, frail, elderly cachectic, opens eyes, says few words, somewhat confused abt details , oriented to self only HEENt; pupils equal,  poor dental hygiene,  Respiratory: decreased BS at bases Cardiovascular: Regular rate and rhythm, no murmurs Abdomen: soft, NT,  Bowel sounds positive.  Musculoskeletal: frail, malnourished Skin: no rashes, lesions, ulcers. No induration Neurologic: more alert, moves all extremities to command, no localising signs  Psychiatric: unable to assess    Data Reviewed: I have personally reviewed following labs and imaging studies  CBC:  Recent Labs Lab 10/25/16 1517 10/25/16 1953 10/26/16 0530  WBC 9.4 8.4 8.2  NEUTROABS 7.4  --   --   HGB 12.9 11.2* 10.5*  HCT 41.7 35.7* 34.5*  MCV 84.6 84.8 85.4  PLT 180 162 178   Basic Metabolic Panel:  Recent Labs Lab 10/25/16 1517 10/25/16 1953 10/26/16 0530  NA 147*  --  147*  K 5.6*  --  5.2*  CL 114*  --  116*  CO2 22  --  22  GLUCOSE 169*  --  137*  BUN 57*  --  51*  CREATININE 2.51* 2.34* 2.34*  CALCIUM 9.9  --  8.9   GFR: Estimated Creatinine Clearance: 11.3 mL/min (by C-G formula based on SCr of 2.34 mg/dL (H)). Liver Function Tests:  Recent Labs Lab 10/25/16 1517  AST 27  ALT 65*  ALKPHOS 59  BILITOT 0.7  PROT 6.4*  ALBUMIN 3.2*   No results for input(s): LIPASE, AMYLASE in the last 168 hours. No results for input(s): AMMONIA in the last 168 hours. Coagulation Profile: No results for input(s): INR, PROTIME in the last 168 hours. Cardiac Enzymes:  Recent Labs Lab 10/25/16 1517  TROPONINI <0.03   BNP (last 3  results) No results for input(s): PROBNP in the last 8760 hours. HbA1C: No results for input(s): HGBA1C in the last 72 hours. CBG:  Recent Labs Lab 10/26/16 0814  GLUCAP 124*   Lipid Profile: No results for input(s): CHOL, HDL, LDLCALC, TRIG, CHOLHDL, LDLDIRECT in the last 72 hours. Thyroid Function Tests: No results for input(s): TSH, T4TOTAL, FREET4, T3FREE, THYROIDAB in the last 72 hours. Anemia Panel: No results for input(s): VITAMINB12, FOLATE, FERRITIN, TIBC, IRON, RETICCTPCT in the last 72 hours. Urine analysis:    Component Value Date/Time   COLORURINE YELLOW 10/25/2016 1549   APPEARANCEUR CLEAR 10/25/2016 1549   LABSPEC 1.012 10/25/2016 1549   PHURINE 5.0 10/25/2016 1549   GLUCOSEU NEGATIVE 10/25/2016 1549   HGBUR NEGATIVE 10/25/2016 1549   BILIRUBINUR NEGATIVE 10/25/2016 1549   KETONESUR NEGATIVE 10/25/2016 1549   PROTEINUR NEGATIVE 10/25/2016 1549   UROBILINOGEN 0.2 06/25/2007 1145   NITRITE NEGATIVE 10/25/2016 1549   LEUKOCYTESUR NEGATIVE 10/25/2016 1549   Sepsis Labs: @LABRCNTIP (procalcitonin:4,lacticidven:4)  ) Recent Results (from the past 240 hour(s))  MRSA PCR Screening     Status: None   Collection Time: 10/26/16  5:58 AM  Result Value Ref Range Status   MRSA by PCR NEGATIVE NEGATIVE Final    Comment:        The GeneXpert MRSA Assay (FDA approved for NASAL specimens only), is one component of a comprehensive MRSA colonization surveillance program. It is not intended to diagnose MRSA infection nor to guide or monitor treatment for MRSA infections.          Radiology Studies: Dg Chest 1 View  Result Date: 10/25/2016 CLINICAL DATA:  Altered mental status EXAM: CHEST 1 VIEW COMPARISON:  July 27, 2013 FINDINGS: There is mild atelectatic change in left lower lobe. There is no edema or consolidation. Heart size and pulmonary vascularity are normal. No adenopathy. There is atherosclerotic calcification aorta. There is old healed rib trauma on  the left inferiorly. Bones are osteoporotic. IMPRESSION: Mild left lower lobe atelectatic change. No edema or consolidation. Stable cardiac silhouette. There is aortic atherosclerosis. Bones are osteoporotic. Electronically Signed   By: Bretta Bang III M.D.   On: 10/25/2016 18:13   Ct Head Wo Contrast  Result Date: 10/25/2016 CLINICAL DATA:  83 year old female with altered mental status. Initial encounter. EXAM: CT HEAD WITHOUT CONTRAST TECHNIQUE: Contiguous axial images were obtained from the base of the skull through the vertex without intravenous contrast. COMPARISON:  12/18/2015 and earlier. FINDINGS: Brain: Stable cerebral volume. Stable lateral and third ventricular prominence. No midline shift, mass effect, or evidence of intracranial mass lesion. No acute intracranial hemorrhage identified. Patchy bilateral cerebral white matter hypodensity is stable. No cortically based acute infarct identified. No cortical encephalomalacia identified. Vascular: Calcified atherosclerosis at the skull base. No  suspicious intracranial vascular hyperdensity. Skull: No acute osseous abnormality identified. Sinuses/Orbits: Visualized paranasal sinuses and mastoids are stable and well pneumatized. Other: No acute orbit or scalp soft tissue findings. IMPRESSION: No acute intracranial abnormality. Stable non contrast CT appearance of the brain. Electronically Signed   By: H  Hall M.D.   On: 10/25/2016 15:26        Scheduled MeOdessa Flemingds: . donepezil  10 mg Oral QHS  . enoxaparin (LOVENOX) injection  30 mg Subcutaneous Q24H  . febuxostat  40 mg Oral Daily  . insulin aspart  0-9 Units Subcutaneous TID WC  . levothyroxine  50 mcg Oral QAC breakfast  . megestrol  400 mg Oral BID  . mirtazapine  7.5 mg Oral QHS   Continuous Infusions: . sodium chloride 100 mL/hr at 10/26/16 0722     LOS: 1 day    Time spent: 35min    Zannie CovePreetha Mckaila Duffus, MD [facility] Pager [phone]  If 7PM-7AM, please contact  night-coverage [email or web address] Password [email or web address] 10/26/2016, 10:24 AM

## 2016-10-26 NOTE — Evaluation (Signed)
Clinical/Bedside Swallow Evaluation Patient Details  Name: Cassidy Paul MRN: [id number] Date of Birth: 23-Jun-1922  Today's Date: 10/26/2016 Time: SLP Start Time (ACUTE ONLY): 1411 SLP Stop Time (ACUTE ONLY): 1422 SLP Time Calculation (min) (ACUTE ONLY): 11 min  Past Medical History:  Past Medical History:  Diagnosis Date  . Abnormal gait   . Alzheimer disease   . Arthritis   . Depression   . Diabetes mellitus without complication (HCC)   . Hyperlipemia   . Thyroid disease    Past Surgical History:  Past Surgical History:  Procedure Laterality Date  . CHOLECYSTECTOMY     HPI:  83 y.o.femalewith medical history significant of DM, CHF, CKD3, Dementia, Bed bound, resident of SNF, almost total care was brought to the ER due to AMS and questionable facial droop (son believes this is at baseline). CT head negative for acute changes. She was admitted for AKI/hypernatremia.   Assessment / Plan / Recommendation Clinical Impression  Pt appears to have a cognitively-based, mostly oral dysphagia with oral holding and delayed transit that is particularly noted with pureed solids. Pt had one immediate cough upon initial presentation of thin liquids by spoon, but with pt initiating intake via straw, no further overt signs of aspiration were noted. Pt needed tactile stimulation from dry spoon and liquid washes to clear bites of puree. Recommend to initiate full liquid diet for today. Will f/u for tolerance and readiness to advance as mentation returns to baseline.    Aspiration Risk  Mild aspiration risk    Diet Recommendation Thin liquid   Liquid Administration via: Straw Medication Administration: Crushed with puree Supervision: Staff to assist with self feeding;Full supervision/cueing for compensatory strategies Compensations: Minimize environmental distractions;Slow rate;Small sips/bites;Follow solids with liquid Postural Changes: Seated upright at 90 degrees;Remain upright for at least  30 minutes after po intake    Other  Recommendations Oral Care Recommendations: Oral care BID Other Recommendations: Have oral suction available   Follow up Recommendations Skilled Nursing facility      Frequency and Duration min 2x/week  2 weeks       Prognosis Prognosis for Safe Diet Advancement: Good Barriers to Reach Goals: Cognitive deficits      Swallow Study   General HPI: 83 y.o.femalewith medical history significant of DM, CHF, CKD3, Dementia, Bed bound, resident of SNF, almost total care was brought to the ER due to AMS and questionable facial droop (son believes this is at baseline). CT head negative for acute changes. She was admitted for AKI/hypernatremia. Type of Study: Bedside Swallow Evaluation Previous Swallow Assessment: none in chart Diet Prior to this Study: NPO Temperature Spikes Noted: No Respiratory Status: Room air History of Recent Intubation: No Behavior/Cognition: Alert;Cooperative;Requires cueing Oral Cavity Assessment: Dry Oral Care Completed by SLP: No Oral Cavity - Dentition: Adequate natural dentition Self-Feeding Abilities: Needs assist Patient Positioning: Upright in bed Baseline Vocal Quality: Normal    Oral/Motor/Sensory Function Overall Oral Motor/Sensory Function: Mild impairment (mild right facial droop, baseline per MD note)   Ice Chips Ice chips: Within functional limits Presentation: Spoon   Thin Liquid Thin Liquid: Impaired Presentation: Spoon;Straw Pharyngeal  Phase Impairments: Cough - Immediate (x1)    Nectar Thick Nectar Thick Liquid: Not tested   Honey Thick Honey Thick Liquid: Not tested   Puree Puree: Impaired Presentation: Spoon Oral Phase Functional Implications: Prolonged oral transit;Oral holding   Solid   GO   Solid: Not tested        Cassidy Paul, Cassidy Paul 10/26/2016,2:36 PM  Cassidy Paul, Cassidy Paul [phone]

## 2016-10-26 NOTE — Progress Notes (Signed)
Initial Nutrition Assessment  DOCUMENTATION CODES:   Severe malnutrition in context of chronic illness, Underweight  INTERVENTION:    Ensure Enlive po BID, each supplement provides 350 kcal and 20 grams of protein  NUTRITION DIAGNOSIS:   Malnutrition related to chronic illness as evidenced by severe depletion of body fat, severe depletion of muscle mass  GOAL:   Patient will meet greater than or equal to 90% of their needs  MONITOR:   PO intake, Supplement acceptance, Labs, Weight trends, Skin, I & O's  REASON FOR ASSESSMENT:   Low Braden  ASSESSMENT:   83 y.o.Femalewith medical history significant of DM, CHF, CKD3, Dementia, Bed bound, resident of SNF; was brought to the ER after saff at SNF noticed pt was more sleepy for last 1-2 days; CNA felt that she had a mild facial droop and was sent to the ER, her son refutes this and reports that her face is at baseline.                               RD unable to obtain nutrition hx. PTA medication list reveals pt receives Beneprotein powder 2 scoops TID. S/p bedside swallow evaluation today >> SLP rec thin liquids. Low braden score places pt at risk for skin breakdown. CBG's 124-113.  Nutrition-Focused physical exam completed. Findings are severe fat depletion, severe muscle depletion, and no edema.   Diet Order:  Diet full liquid Room service appropriate? Yes; Fluid consistency: Thin  Skin:  Reviewed, no issues  Last BM:  PTA  Height:   Ht Readings from Last 1 Encounters:  10/25/16 5\' 3"  (1.6 m)    Weight:   Wt Readings from Last 1 Encounters:  10/25/16 102 lb 11.2 oz (46.6 kg)    Ideal Body Weight:  52.2 kg  BMI:  Body mass index is 18.19 kg/m.  Estimated Nutritional Needs:   Kcal:  1200-1400  Protein:  60-70 gm  Fluid:  >/= 1.5 L  EDUCATION NEEDS:   No education needs identified at this time  Maureen ChattersKatie Nevaya Nagele, RD, LDN Pager #: [phone] After-Hours Pager #: [phone]

## 2016-10-26 NOTE — Clinical Social Work Note (Signed)
Clinical Social Work Assessment  Patient Details  Name: Cassidy NoeLaura M Podgurski MRN: [id number] Date of Birth: 1922-01-23  Date of referral:  10/26/16               Reason for consult:  Discharge Planning                Permission sought to share information with:  Facility Medical sales representativeContact Representative, Family Supports Permission granted to share information::  No  Name::     Clide CliffRicky, son, [phone]  Agency::  [facility]  Relationship::     Contact Information:     Housing/Transportation Living arrangements for the past 2 months:  Assisted Living Facility Source of Information:  Adult Children Patient Interpreter Needed:  None Criminal Activity/Legal Involvement Pertinent to Current Situation/Hospitalization:  No - Comment as needed Significant Relationships:  Adult Children Lives with:  Facility Resident Do you feel safe going back to the place where you live?  Yes Need for family participation in patient care:  Yes (Comment)  Care giving concerns:  CSW received consult regarding discharge planning. Patient is disoriented. CSW spoke with patient's [facility] who [facility] Per [facility], patient is total care. Patient's son would like her to return there at discharge. CSW to continue to follow.   Social Worker assessment / plan: Patient to return to memory care at discharge by PTAR.  Employment status:  Retired Database administratornsurance information:  Managed Medicare PT Recommendations:  No Follow Up Information / Referral to community resources:     Patient/Family's Response to care:  Patient's son expressed agreement with discharge plan.  Patient/Family's Understanding of and Emotional Response to Diagnosis, Current Treatment, and Prognosis: Patient's son expressed understanding of CSW role and discharge process. No questions/concerns about plan or treatment.    Emotional Assessment Appearance:  Appears stated age Attitude/Demeanor/Rapport:  Unable to  Assess Affect (typically observed):  Unable to Assess Orientation:  Oriented to Self Alcohol / Substance use:  Not Applicable Psych involvement (Current and /or in the community):  No (Comment)  Discharge Needs  Concerns to be addressed:  Care Coordination Readmission within the last 30 days:  No Current discharge risk:  None Barriers to Discharge:  Continued Medical Work up   [employer] S Karron Alvizo, LCSWA 10/26/2016, 1:37 PM

## 2016-10-27 DIAGNOSIS — E43 Unspecified severe protein-calorie malnutrition: Secondary | ICD-10-CM | POA: Insufficient documentation

## 2016-10-27 LAB — CBC
HEMATOCRIT: 32.9 % — AB (ref 36.0–46.0)
HEMOGLOBIN: 10.2 g/dL — AB (ref 12.0–15.0)
MCH: 26.6 pg (ref 26.0–34.0)
MCHC: 31 g/dL (ref 30.0–36.0)
MCV: 85.7 fL (ref 78.0–100.0)
Platelets: 153 10*3/uL (ref 150–400)
RBC: 3.84 MIL/uL — AB (ref 3.87–5.11)
RDW: 16.3 % — ABNORMAL HIGH (ref 11.5–15.5)
WBC: 5.4 10*3/uL (ref 4.0–10.5)

## 2016-10-27 LAB — BASIC METABOLIC PANEL
ANION GAP: 9 (ref 5–15)
BUN: 40 mg/dL — ABNORMAL HIGH (ref 6–20)
CO2: 17 mmol/L — ABNORMAL LOW (ref 22–32)
Calcium: 8.3 mg/dL — ABNORMAL LOW (ref 8.9–10.3)
Chloride: 118 mmol/L — ABNORMAL HIGH (ref 101–111)
Creatinine, Ser: 1.99 mg/dL — ABNORMAL HIGH (ref 0.44–1.00)
GFR calc Af Amer: 24 mL/min — ABNORMAL LOW (ref >60)
GFR, EST NON AFRICAN AMERICAN: 21 mL/min — AB (ref >60)
Glucose, Bld: 104 mg/dL — ABNORMAL HIGH (ref 65–99)
POTASSIUM: 4.7 mmol/L (ref 3.5–5.1)
SODIUM: 144 mmol/L (ref 135–145)

## 2016-10-27 LAB — GLUCOSE, CAPILLARY
GLUCOSE-CAPILLARY: 243 mg/dL — AB (ref 65–99)
GLUCOSE-CAPILLARY: 116 mg/dL — AB (ref 65–99)
Glucose-Capillary: 91 mg/dL (ref 65–99)
Glucose-Capillary: 125 mg/dL — ABNORMAL HIGH (ref 65–99)

## 2016-10-27 MED ORDER — RESOURCE THICKENUP CLEAR PO POWD
ORAL | Status: DC | PRN
  Administered 2016-10-28: 1 g via ORAL
  Filled 2016-10-27: qty 125

## 2016-10-27 NOTE — Progress Notes (Signed)
PROGRESS NOTE    Cassidy Paul  ZOX:[id number] DOB: 1922/02/04 DOA: 10/25/2016 PCP: Florentina Jenny, MD Brief Narrative: Cassidy Paul is a 83 y.o. female with medical history significant of DM, CHF, CKD3, Dementia, Bed bound, resident of SNF, almost total care was brought to the ER due to the above complaints. Per Staff at SNF more sleepy for last 1-2days and total a CNA felt that she had a mild facial droop and was sent to the ER, her son refutes this and reports that her face is at baseline but she does appears more sleepy and drowsy than usual. No H/o fevers or chills In ED Found to have AKI and hypernatremia, CT head unremarkable  Assessment & Plan:   AKI (acute kidney injury) (HCC)\ -likely prerenal from poor Po intake -creatinine up to 2.5 from baseline of 1.2 with hypernatremia and hyperkalemia -slow improvement to 1.9 now, will cut down 1/2 NS to 50cc/hr -Renal US without hydronephrosis -ACE and lasix on hold  Acute metabolic encephalopathy -due to AKI/hyperntremia with baseline Dementia -mild improvement, close to baseline -CT head unremarkable -unless new or worsening mentation no plans for MRI-d/w son he agrees -CXR/UA unremarkable -s/p SLP eval, now on Liquid diet, SLp to re-assess to see if diet can be upgraded prior to discharge   Alzheimer disease -total care, SNF resident, discussed poor prognosis and code status and son agreed to DNR -definitively needs Palliative eval at SNF    H/o CHF (congestive heart failure) (HCC) - holding lasix due to AKI , monitor -no ECHO in our system    Diabetes mellitus (HCC) -hold metformin, SSI  DVT prophylaxis: lovenox Code Status: DNR after d/w son Lisett Dirusso Family Communication: called and d/w son Francesca Strome 1/15 Disposition Plan: inpatient  Consultants:      Subjective: Feels ok, no complaints, no events overnight, eating a little  Objective: Vitals:   10/26/16 0549 10/26/16 1427 10/26/16 2041 10/27/16  0623  BP: (!) 101/59 (!) 124/109 114/70 (!) 99/50  Pulse: 84 99 73 60  Resp: 16 16 16 16   Temp: 98.1 F (36.7 C) 98 F (36.7 C) 98.4 F (36.9 C) 98.8 F (37.1 C)  TempSrc:  Oral Oral Oral  SpO2: 93% 93% 92% 97%  Weight:      Height:        Intake/Output Summary (Last 24 hours) at 10/27/16 1113 Last data filed at 10/27/16 0300  Gross per 24 hour  Intake          1594.58 ml  Output                0 ml  Net          1594.58 ml   Filed Weights   10/25/16 1932  Weight: 46.6 kg (102 lb 11.2 oz)    Examination: Gen: alert, frail, elderly cachectic, opens eyes, says few words, somewhat confused abt details , oriented to self  And partly to place HEENt; pupils equal,  poor dental hygiene,  Respiratory: decreased BS at bases Cardiovascular: Regular rate and rhythm, no murmurs Abdomen: soft, NT,  Bowel sounds positive.  Musculoskeletal: frail, malnourished Skin: no rashes, lesions, ulcers. No induration Neurologic: more alert, moves all extremities to command, no localising signs  Psychiatric: unable to assess    Data Reviewed: I have personally reviewed following labs and imaging studies  CBC:  Recent Labs Lab 10/25/16 1517 10/25/16 1953 10/26/16 0530 10/27/16 0536  WBC 9.4 8.4 8.2 5.4  NEUTROABS 7.4  --   --   --  HGB 12.9 11.2* 10.5* 10.2*  HCT 41.7 35.7* 34.5* 32.9*  MCV 84.6 84.8 85.4 85.7  PLT 180 162 178 153   Basic Metabolic Panel:  Recent Labs Lab 10/25/16 1517 10/25/16 1953 10/26/16 0530 10/27/16 0536  NA 147*  --  147* 144  K 5.6*  --  5.2* 4.7  CL 114*  --  116* 118*  CO2 22  --  22 17*  GLUCOSE 169*  --  137* 104*  BUN 57*  --  51* 40*  CREATININE 2.51* 2.34* 2.34* 1.99*  CALCIUM 9.9  --  8.9 8.3*   GFR: Estimated Creatinine Clearance: 13.3 mL/min (by C-G formula based on SCr of 1.99 mg/dL (H)). Liver Function Tests:  Recent Labs Lab 10/25/16 1517  AST 27  ALT 65*  ALKPHOS 59  BILITOT 0.7  PROT 6.4*  ALBUMIN 3.2*   No results  for input(s): LIPASE, AMYLASE in the last 168 hours. No results for input(s): AMMONIA in the last 168 hours. Coagulation Profile: No results for input(s): INR, PROTIME in the last 168 hours. Cardiac Enzymes:  Recent Labs Lab 10/25/16 1517  TROPONINI <0.03   BNP (last 3 results) No results for input(s): PROBNP in the last 8760 hours. HbA1C: No results for input(s): HGBA1C in the last 72 hours. CBG:  Recent Labs Lab 10/26/16 0814 10/26/16 1207 10/26/16 1736 10/26/16 2045 10/27/16 0814  GLUCAP 124* 113* 108* 99 91   Lipid Profile: No results for input(s): CHOL, HDL, LDLCALC, TRIG, CHOLHDL, LDLDIRECT in the last 72 hours. Thyroid Function Tests: No results for input(s): TSH, T4TOTAL, FREET4, T3FREE, THYROIDAB in the last 72 hours. Anemia Panel: No results for input(s): VITAMINB12, FOLATE, FERRITIN, TIBC, IRON, RETICCTPCT in the last 72 hours. Urine analysis:    Component Value Date/Time   COLORURINE YELLOW 10/25/2016 1549   APPEARANCEUR CLEAR 10/25/2016 1549   LABSPEC 1.012 10/25/2016 1549   PHURINE 5.0 10/25/2016 1549   GLUCOSEU NEGATIVE 10/25/2016 1549   HGBUR NEGATIVE 10/25/2016 1549   BILIRUBINUR NEGATIVE 10/25/2016 1549   KETONESUR NEGATIVE 10/25/2016 1549   PROTEINUR NEGATIVE 10/25/2016 1549   UROBILINOGEN 0.2 06/25/2007 1145   NITRITE NEGATIVE 10/25/2016 1549   LEUKOCYTESUR NEGATIVE 10/25/2016 1549   Sepsis Labs: @LABRCNTIP (procalcitonin:4,lacticidven:4)  ) Recent Results (from the past 240 hour(s))  MRSA PCR Screening     Status: None   Collection Time: 10/26/16  5:58 AM  Result Value Ref Range Status   MRSA by PCR NEGATIVE NEGATIVE Final    Comment:        The GeneXpert MRSA Assay (FDA approved for NASAL specimens only), is one component of a comprehensive MRSA colonization surveillance program. It is not intended to diagnose MRSA infection nor to guide or monitor treatment for MRSA infections.          Radiology Studies: Dg Chest 1  View  Result Date: 10/25/2016 CLINICAL DATA:  Altered mental status EXAM: CHEST 1 VIEW COMPARISON:  July 27, 2013 FINDINGS: There is mild atelectatic change in left lower lobe. There is no edema or consolidation. Heart size and pulmonary vascularity are normal. No adenopathy. There is atherosclerotic calcification aorta. There is old healed rib trauma on the left inferiorly. Bones are osteoporotic. IMPRESSION: Mild left lower lobe atelectatic change. No edema or consolidation. Stable cardiac silhouette. There is aortic atherosclerosis. Bones are osteoporotic. Electronically Signed   By: Bretta Bang III M.D.   On: 10/25/2016 18:13   Ct Head Wo Contrast  Result Date: 10/25/2016 CLINICAL DATA:  83 year old female  with altered mental status. Initial encounter. EXAM: CT HEAD WITHOUT CONTRAST TECHNIQUE: Contiguous axial images were obtained from the base of the skull through the vertex without intravenous contrast. COMPARISON:  12/18/2015 and earlier. FINDINGS: Brain: Stable cerebral volume. Stable lateral and third ventricular prominence. No midline shift, mass effect, or evidence of intracranial mass lesion. No acute intracranial hemorrhage identified. Patchy bilateral cerebral white matter hypodensity is stable. No cortically based acute infarct identified. No cortical encephalomalacia identified. Vascular: Calcified atherosclerosis at the skull base. No suspicious intracranial vascular hyperdensity. Skull: No acute osseous abnormality identified. Sinuses/Orbits: Visualized paranasal sinuses and mastoids are stable and well pneumatized. Other: No acute orbit or scalp soft tissue findings. IMPRESSION: No acute intracranial abnormality. Stable non contrast CT appearance of the brain. Electronically Signed   By: Odessa FlemingH  Hall M.D.   On: 10/25/2016 15:26   [place] Renal  Result Date: 10/27/2016 CLINICAL DATA:  83 y/o  F; acute kidney injury. EXAM: RENAL / URINARY TRACT ULTRASOUND COMPLETE COMPARISON:  None.  FINDINGS: Right Kidney: Length: 9.0 cm. No mass or hydronephrosis identified. Increased echogenicity of renal parenchyma. Left Kidney: Length: 9.1 cm. No mass or hydronephrosis identified. Increased echogenicity of renal parenchyma. Bladder: Appears normal for degree of bladder distention. IMPRESSION: Mild increased echogenicity of renal parenchyma consistent with medical renal disease. No mass or hydronephrosis identified. Electronically Signed   By: Mitzi HansenLance  Furusawa-Stratton M.D.   On: 10/27/2016 01:28        Scheduled Meds: . donepezil  10 mg Oral QHS  . enoxaparin (LOVENOX) injection  30 mg Subcutaneous Q24H  . febuxostat  40 mg Oral Daily  . feeding supplement (ENSURE ENLIVE)  237 mL Oral BID BM  . insulin aspart  0-9 Units Subcutaneous TID WC  . levothyroxine  50 mcg Oral QAC breakfast  . megestrol  400 mg Oral BID  . mirtazapine  7.5 mg Oral QHS   Continuous Infusions: . sodium chloride 75 mL (10/26/16 1931)     LOS: 2 days    Time spent: 35min    Zannie CovePreetha Dominic Mahaney, MD [facility] Pager [phone]  If 7PM-7AM, please contact night-coverage [email or web address] Password [email or web address] 10/27/2016, 11:13 AM

## 2016-10-27 NOTE — Progress Notes (Signed)
Speech Language Pathology Treatment: Dysphagia  Patient Details Name: Cassidy Paul MRN: [id number] DOB: May 17, 1922 Today's Date: 10/27/2016 Time: 1019-1030 SLP Time Calculation (min) (ACUTE ONLY): 11 min  Assessment / Plan / Recommendation Clinical Impression  Dysphagia treatment for safety and possible upgrade. Immediate and consistent coughing following straw and cup sips water due to likely compromised airway. Oral holding and prolonged transit with puree. No family present to discuss relationship of dementia and swallow function. For now recommend Dys 1 (puree) diet and nectar thick liquids. Will speak with family regarding plan (thin versus thick. If she continues to cough with thin, this may not be comforting to pt.   HPI HPI: 83 y.o.femalewith medical history significant of DM, CHF, CKD3, Dementia, Bed bound, resident of SNF, almost total care was brought to the ER due to AMS and questionable facial droop (son believes this is at baseline). CT head negative for acute changes. She was admitted for AKI/hypernatremia.      SLP Plan  Continue with current plan of care     Recommendations  Diet recommendations: Dysphagia 1 (puree);Nectar-thick liquid Liquids provided via: Cup;No straw Medication Administration: Crushed with puree Supervision: Staff to assist with self feeding;Full supervision/cueing for compensatory strategies Compensations: Minimize environmental distractions;Slow rate;Small sips/bites;Follow solids with liquid;Lingual sweep for clearance of pocketing Postural Changes and/or Swallow Maneuvers: Seated upright 90 degrees                Oral Care Recommendations: Oral care BID Follow up Recommendations: Skilled Nursing facility Plan: Continue with current plan of care       GO                Cassidy MacadamiaLitaker, Cassidy Paul 10/27/2016, 2:15 PM  Cassidy CoonsLisa Paul Lonell FaceLitaker Paul Cassidy Paul Cassidy Paul

## 2016-10-27 NOTE — Care Management Note (Signed)
Case Management Note  Patient Details  Name: Lesleigh NoeLaura M Cervone MRN: [id number] Date of Birth: 02-18-1922  Subjective/Objective:    Admitted with AKI.        Pt with medical history significant of DM, CHF, CKD3, dementia, bed bound/ total care.  Billee CashingRicky Magadan [facility](Son)     [phone]      PCP: Florentina JennyHenry Tripp  Action/Plan: Plan is to d/c to SNF when medically stable. CWS aware and managing disposition back to SNF. CM continue to monitor as needs presents.  Expected Discharge Date:                  Expected Discharge Plan:  SNF/[facility] ( Memory Care Unit)  In-House Referral:  Clinical Social Work  Discharge planning Services  CM Consult  Post Acute Care Choice:    Choice offered to:     DME Arranged:    DME Agency:     HH Arranged:    HH Agency:     Status of Service:  In process, will continue to follow  If discussed at Long Length of Stay Meetings, dates discussed:    Additional Comments:  Epifanio LeschesCole, Sarajane Fambrough Hudson, RN 10/27/2016, 9:58 AM

## 2016-10-28 DIAGNOSIS — Z515 Encounter for palliative care: Secondary | ICD-10-CM

## 2016-10-28 DIAGNOSIS — Z7189 Other specified counseling: Secondary | ICD-10-CM

## 2016-10-28 DIAGNOSIS — F0280 Dementia in other diseases classified elsewhere without behavioral disturbance: Secondary | ICD-10-CM

## 2016-10-28 DIAGNOSIS — G309 Alzheimer's disease, unspecified: Secondary | ICD-10-CM

## 2016-10-28 DIAGNOSIS — G9340 Encephalopathy, unspecified: Secondary | ICD-10-CM

## 2016-10-28 LAB — CBC
HCT: 33.8 % — ABNORMAL LOW (ref 36.0–46.0)
Hemoglobin: 10.7 g/dL — ABNORMAL LOW (ref 12.0–15.0)
MCH: 26.8 pg (ref 26.0–34.0)
MCHC: 31.7 g/dL (ref 30.0–36.0)
MCV: 84.7 fL (ref 78.0–100.0)
PLATELETS: 174 10*3/uL (ref 150–400)
RBC: 3.99 MIL/uL (ref 3.87–5.11)
RDW: 16 % — ABNORMAL HIGH (ref 11.5–15.5)
WBC: 5.8 10*3/uL (ref 4.0–10.5)

## 2016-10-28 LAB — BASIC METABOLIC PANEL
Anion gap: 6 (ref 5–15)
BUN: 34 mg/dL — ABNORMAL HIGH (ref 6–20)
CALCIUM: 8.6 mg/dL — AB (ref 8.9–10.3)
CO2: 21 mmol/L — ABNORMAL LOW (ref 22–32)
CREATININE: 1.91 mg/dL — AB (ref 0.44–1.00)
Chloride: 118 mmol/L — ABNORMAL HIGH (ref 101–111)
GFR, EST AFRICAN AMERICAN: 25 mL/min — AB (ref >60)
GFR, EST NON AFRICAN AMERICAN: 22 mL/min — AB (ref >60)
Glucose, Bld: 130 mg/dL — ABNORMAL HIGH (ref 65–99)
Potassium: 4.3 mmol/L (ref 3.5–5.1)
SODIUM: 145 mmol/L (ref 135–145)

## 2016-10-28 LAB — GLUCOSE, CAPILLARY
GLUCOSE-CAPILLARY: 118 mg/dL — AB (ref 65–99)
GLUCOSE-CAPILLARY: 187 mg/dL — AB (ref 65–99)
Glucose-Capillary: 147 mg/dL — ABNORMAL HIGH (ref 65–99)

## 2016-10-28 NOTE — Progress Notes (Signed)
[facility] Anticipated DC date: 10/28/16 Family notified: Son Transport by: Delon Sacramento[place] 5pm   Per MD [facility]. RN, patient, patient's family, and facility notified of DC. Discharge Summary sent to facility. RN given number for report. DC packet on chart. Ambulance transport requested for patient.   CSW signing off.  Cristobal GoldmannNadia Brianah Hopson, [place] Clinical Social Worker [phone]

## 2016-10-28 NOTE — Progress Notes (Signed)
PROGRESS NOTE    Cassidy NoeLaura M Speich  WGN:[id number] DOB: October 09, 1922 DOA: 10/25/2016 PCP: Florentina JennyRIPP, HENRY, MD Brief Narrative: Cassidy Paul is a 83 y.o. female with medical history significant of DM, CHF, CKD3, Dementia, Bed bound, resident of SNF, almost total care was brought to the ER due to the above complaints. Per Staff at SNF more sleepy for last 1-2days and total a CNA felt that she had a mild facial droop and was sent to the ER, her son refutes this and reports that her face is at baseline but she does appears more sleepy and drowsy than usual. No H/o fevers or chills In ED Found to have AKI and hypernatremia, CT head unremarkable  Assessment & Plan:   AKI (acute kidney injury) (HCC)\ -likely prerenal from poor Po intake, diuretics/ACE etc -creatinine up to 2.5 from baseline of 1.2 with hypernatremia and hyperkalemia -slow improvement to 1.9 and plateaued now , cut down 1/2 NS to 50cc/hr -Renal US without hydronephrosis -ACE and lasix on hold  Acute metabolic encephalopathy -due to AKI/hyperntremia with baseline Dementia -mild improvement, back to baseline now  -CT head unremarkable -unless new or worsening mentation no plans for MRI-d/w son he agreed -CXR/UA unremarkable -s/p SLP eval, Dysphagia noted, now started on D1 diet with nectar thick liquids -d/w Son, Palliative consulted   Alzheimer disease -total care, Bed bound, SNF resident, discussed poor prognosis and code status and son agreed to DNR on admission -Po intake is minimal, discussed poor prognosis with son and recommended Palliative consult for Goals of care    H/o CHF (congestive heart failure) (HCC) - holding lasix due to AKI , monitor -no ECHO in our system    Diabetes mellitus (HCC) -hold metformin, SSI  DVT prophylaxis: lovenox Code Status: DNR after d/w son Arlyss Repressickie Lacorte Family Communication: called and d/w son Arlyss RepressRickie Rebman again today Disposition Plan: SNF pending Palliative meeting  Consultants:        Subjective: sleeping most of the time, no complaints, no events overnight, eating very little  Objective: Vitals:   10/27/16 0623 10/27/16 1347 10/27/16 2306 10/28/16 0504  BP: (!) 99/50 118/74 (!) 115/52 (!) 139/53  Pulse: 60 (!) 58 65 70  Resp: 16 16 18 20   Temp: 98.8 F (37.1 C) 98.2 F (36.8 C) 99.9 F (37.7 C) 98 F (36.7 C)  TempSrc: Oral Oral Oral Oral  SpO2: 97% 100% 97% 97%  Weight:      Height:        Intake/Output Summary (Last 24 hours) at 10/28/16 0911 Last data filed at 10/27/16 1400  Gross per 24 hour  Intake              237 ml  Output                0 ml  Net              237 ml   Filed Weights   10/25/16 1932  Weight: 46.6 kg (102 lb 11.2 oz)    Examination: Gen: alert, frail, elderly cachectic, opens eyes, says few words, somewhat confused abt details , oriented to self  And partly to place HEENt; pupils equal,  poor dental hygiene,  Respiratory: decreased BS at bases Cardiovascular: Regular rate and rhythm, no murmurs Abdomen: soft, NT,  Bowel sounds positive.  Musculoskeletal: frail, malnourished Skin: no rashes, lesions, ulcers. No induration Neurologic: more alert, moves all extremities to command, no localising signs  Psychiatric: unable to assess  Data Reviewed: I have personally reviewed following labs and imaging studies  CBC:  Recent Labs Lab 10/25/16 1517 10/25/16 1953 10/26/16 0530 10/27/16 0536  WBC 9.4 8.4 8.2 5.4  NEUTROABS 7.4  --   --   --   HGB 12.9 11.2* 10.5* 10.2*  HCT 41.7 35.7* 34.5* 32.9*  MCV 84.6 84.8 85.4 85.7  PLT 180 162 178 153   Basic Metabolic Panel:  Recent Labs Lab 10/25/16 1517 10/25/16 1953 10/26/16 0530 10/27/16 0536 10/28/16 0637  NA 147*  --  147* 144 145  K 5.6*  --  5.2* 4.7 4.3  CL 114*  --  116* 118* 118*  CO2 22  --  22 17* 21*  GLUCOSE 169*  --  137* 104* 130*  BUN 57*  --  51* 40* 34*  CREATININE 2.51* 2.34* 2.34* 1.99* 1.91*  CALCIUM 9.9  --  8.9 8.3* 8.6*    GFR: Estimated Creatinine Clearance: 13.8 mL/min (by C-G formula based on SCr of 1.91 mg/dL (H)). Liver Function Tests:  Recent Labs Lab 10/25/16 1517  AST 27  ALT 65*  ALKPHOS 59  BILITOT 0.7  PROT 6.4*  ALBUMIN 3.2*   No results for input(s): LIPASE, AMYLASE in the last 168 hours. No results for input(s): AMMONIA in the last 168 hours. Coagulation Profile: No results for input(s): INR, PROTIME in the last 168 hours. Cardiac Enzymes:  Recent Labs Lab 10/25/16 1517  TROPONINI <0.03   BNP (last 3 results) No results for input(s): PROBNP in the last 8760 hours. HbA1C: No results for input(s): HGBA1C in the last 72 hours. CBG:  Recent Labs Lab 10/27/16 0814 10/27/16 1203 10/27/16 1735 10/27/16 2314 10/28/16 0733  GLUCAP 91 125* 243* 116* 118*   Lipid Profile: No results for input(s): CHOL, HDL, LDLCALC, TRIG, CHOLHDL, LDLDIRECT in the last 72 hours. Thyroid Function Tests: No results for input(s): TSH, T4TOTAL, FREET4, T3FREE, THYROIDAB in the last 72 hours. Anemia Panel: No results for input(s): VITAMINB12, FOLATE, FERRITIN, TIBC, IRON, RETICCTPCT in the last 72 hours. Urine analysis:    Component Value Date/Time   COLORURINE YELLOW 10/25/2016 1549   APPEARANCEUR CLEAR 10/25/2016 1549   LABSPEC 1.012 10/25/2016 1549   PHURINE 5.0 10/25/2016 1549   GLUCOSEU NEGATIVE 10/25/2016 1549   HGBUR NEGATIVE 10/25/2016 1549   BILIRUBINUR NEGATIVE 10/25/2016 1549   KETONESUR NEGATIVE 10/25/2016 1549   PROTEINUR NEGATIVE 10/25/2016 1549   UROBILINOGEN 0.2 06/25/2007 1145   NITRITE NEGATIVE 10/25/2016 1549   LEUKOCYTESUR NEGATIVE 10/25/2016 1549   Sepsis Labs: @LABRCNTIP (procalcitonin:4,lacticidven:4)  ) Recent Results (from the past 240 hour(s))  MRSA PCR Screening     Status: None   Collection Time: 10/26/16  5:58 AM  Result Value Ref Range Status   MRSA by PCR NEGATIVE NEGATIVE Final    Comment:        The GeneXpert MRSA Assay (FDA approved for NASAL  specimens only), is one component of a comprehensive MRSA colonization surveillance program. It is not intended to diagnose MRSA infection nor to guide or monitor treatment for MRSA infections.          Radiology Studies: US Renal  Result Date: 10/27/2016 CLINICAL DATA:  83 y/o  F; acute kidney injury. EXAM: RENAL / URINARY TRACT ULTRASOUND COMPLETE COMPARISON:  None. FINDINGS: Right Kidney: Length: 9.0 cm. No mass or hydronephrosis identified. Increased echogenicity of renal parenchyma. Left Kidney: Length: 9.1 cm. No mass or hydronephrosis identified. Increased echogenicity of renal parenchyma. Bladder: Appears normal for degree of  bladder distention. IMPRESSION: Mild increased echogenicity of renal parenchyma consistent with medical renal disease. No mass or hydronephrosis identified. Electronically Signed   By: Mitzi Hansen M.D.   On: 10/27/2016 01:28        Scheduled Meds: . donepezil  10 mg Oral QHS  . enoxaparin (LOVENOX) injection  30 mg Subcutaneous Q24H  . febuxostat  40 mg Oral Daily  . feeding supplement (ENSURE ENLIVE)  237 mL Oral BID BM  . insulin aspart  0-9 Units Subcutaneous TID WC  . levothyroxine  50 mcg Oral QAC breakfast  . megestrol  400 mg Oral BID  . mirtazapine  7.5 mg Oral QHS   Continuous Infusions: . sodium chloride 1,000 mL (10/28/16 0454)     LOS: 3 days    Time spent:    Zannie Cove, MD [facility] Pager [phone]  If 7PM-7AM, please contact night-coverage [email or web address] Password [email or web address] 10/28/2016, 9:11 AM

## 2016-10-28 NOTE — Progress Notes (Signed)
Report called to [facility]. 

## 2016-10-28 NOTE — Progress Notes (Signed)
[facility]  Osborne Cascoadia Ivon Roedel LCSWA [phone]

## 2016-10-28 NOTE — Discharge Instructions (Signed)
Acute Kidney Injury, Adult Acute kidney injury (AKI) occurs when there is sudden (acute) damage to the kidneys.A small amount of kidney damage may not cause problems, but a large amount of damage may make it difficult or impossible for the kidneys to work the way they should. AKI may develop into long-lasting (chronic) kidney disease. Early detection and treatment of AKI may prevent kidney damage from becoming permanent or getting worse. What are the causes? Common causes of this condition include:  A problem with blood flow to the kidneys. This may be caused by:  Blood loss.  Heart and blood vessel (cardiovascular) disease.  Severe burns.  Liver disease.  Direct damage to the kidneys. This may be caused by:  Some medicines.  A kidney infection.  Poisoning.  Being around or in contact with poisonous (toxic) substances.  A surgical wound.  A hard, direct force to the kidney area.  A sudden blockage of urine flow. This may be caused by:  Cancer.  Kidney stones.  Enlarged prostate in males. What are the signs or symptoms? Symptoms develop slowly and may not be obvious until the kidney damage becomes severe. It is possible to have AKI for years without showing any symptoms. Symptoms of this condition can include:  Swelling (edema) of the face, legs, ankles, or feet.  Numbness, tingling, or loss of feeling (sensation) in the hands or feet.  Tiredness (lethargy).  Nausea or vomiting.  Confusion or trouble concentrating.  Problems with urination, such as:  Painful or burning feeling during urination.  Decreased urine production.  Frequent urination, especially at night.  Bloody urine.  Muscle twitches and cramps, especially in the legs.  Shortness of breath.  Weakness.  Constant itchiness.  Loss of appetite.  Metallic taste in the mouth.  Trouble sleeping.  Pale lining of the eyelids and surface of the eye (conjunctiva). How is this diagnosed? This  condition may be diagnosed with various tests. Tests may include:  Blood tests.  Urine tests.  Imaging tests.  A test in which a sample of tissue is removed from the kidneys to be looked at under a microscope (kidney biopsy). How is this treated? Treatment of AKIvaries depending on the cause and severity of the kidney damage. In mild cases, treatment may not be needed. The kidneys may heal on their own. If AKI is more severe, your health care provider will treat the cause of the kidney damage, help the kidneys heal, and prevent problems from occurring. Severe cases may require a procedure to remove toxic wastes from the body (dialysis) or surgery to repair kidney damage. Surgery may involve:  Repair of a torn kidney.  Removal of a urine flow obstruction. Follow these instructions at home:  Follow your prescribed diet.  Take over-the-counter and prescription medicines only as told by your health care provider.  Do not take any new medicines unless approved by your health care provider. Many medicines can worsen your kidney damage.  Do not take any vitamin and mineral supplements unless approved by your health care provider. Many nutritional supplements can worsen your kidney damage.  The dose of some medicines that you take may need to be adjusted.  Do not use any tobacco products, such as cigarettes, chewing tobacco, and e-cigarettes. If you need help quitting, ask your health care provider.  Keep all follow-up visits as told by your health care provider. This is important.  Keep track of your blood pressure. Report changes in your blood pressure as told by your  health care provider.  Achieve and maintain a healthy weight. If you need help with this, ask your health care provider.  Start or continue an exercise plan. Try to exercise at least 30 minutes a day, 5 days a week.  Stay current with immunizations as told by your health care provider. Where to find more  information:  American Association of Kidney Patients: BombTimer.gl  National Kidney Foundation: www.kidney.[place]: https://mathis.com/  [facility]: www.lifeoptions.org and www.kidneyschool.org Contact a health care provider if:  Your symptoms get worse.  You develop new symptoms. Get help right away if:  You develop symptoms of end-stage kidney disease, which include:  Headaches.  Abnormally dark or light skin.  Numbness in the hands or feet.  Easy bruising.  Frequent hiccups.  Chest pain.  Shortness of breath.  End of menstruation in women.  You have a fever.  You have decreased urine production.  You have pain or bleeding when you urinate. This information is not intended to replace advice given to you by your health care provider. Make sure you discuss any questions you have with your health care provider. Document Released: 04/12/2011 Document Revised: 05/06/2016 Document Reviewed: 05/26/2012 Elsevier Interactive Patient Education  2017 [place] is a service that is designed to provide people who are terminally ill and their families with medical, spiritual, and psychological support. Its aim is to improve your quality of life by keeping you as alert and comfortable as possible. Hospice is performed by a team of health care professionals and volunteers who:  Help keep you comfortable. Hospice can be provided in your home or in a homelike setting. The hospice staff works with your family and friends to help meet your needs. You will enjoy the support of loved ones by receiving much of your basic care from family and friends.  Provide pain relief and manage your symptoms. The staff supply all necessary medicines and equipment.  Provide companionship when you are alone.  Allow you and your family to rest. They may do light housekeeping, prepare meals, and run errands.  Provide counseling. They will  make sure your emotional, spiritual, and social needs and those of your family are being met.  Provide spiritual care. Spiritual care is individualized to meet your needs and your family's needs. It may involve helping you look at what death means to you, say goodbye, or perform a specific religious ceremony or ritual. Hospice teams often include:  A nurse.  A doctor.  Social workers.  Religious leaders (such as a Clinical biochemist).  Trained volunteers. WHEN SHOULD HOSPICE CARE BEGIN? Most people who use hospice are believed to have fewer than 6 months to live. Your family and health care providers can help you decide when hospice services should begin. If your condition improves, you may discontinue the program. WHAT [place]? Most hospice programs are run by nonprofit, independent organizations. Some are affiliated with hospitals, nursing homes, or home health care agencies. Hospice programs can take place in the home or at a hospice center, hospital, or skilled nursing facility. When choosing a hospice program, ask the following questions:  What services are available to me?  What services are offered to my loved ones?  How involved are my loved ones?  How involved is my health care provider?  Who makes up the hospice care team? How are they trained or screened?  How will my pain and symptoms be managed?  If  my circumstances change, can the services be provided in a different setting, such as my home or in the hospital?  Is the program reviewed and licensed by the state or certified in some other way? WHERE CAN I LEARN MORE ABOUT HOSPICE? You can learn about existing hospice programs in your area from your health care providers. You can also read more about hospice online. The websites of the following organizations contain helpful information:  The [facility] and Palliative Care Organization [facility]).  The Hospice Association of America  ([place]).  The [place].  The American Cancer Society (ACS).  Hospice Net. This information is not intended to replace advice given to you by your health care provider. Make sure you discuss any questions you have with your health care provider. Document Released: 01/14/2004 Document Revised: 10/02/2013 Document Reviewed: 08/07/2013 Elsevier Interactive Patient Education  2017 [employer].

## 2016-10-28 NOTE — Progress Notes (Signed)
Speech Language Pathology Treatment: Dysphagia  Patient Details Name: Lesleigh NoeLaura M Wolgamott MRN: [id number] DOB: 1922/10/08 Today's Date: 10/28/2016 Time: [id number] SLP Time Calculation (min) (ACUTE ONLY): 9 min  Assessment / Plan / Recommendation Clinical Impression  Ms. Katrinka BlazingSmith was awake, but kept eyes closed and required moderate cueing in order to attend to tasks and to POs. Pt accepted thin liquids via cup and exhibited signs/symptoms of aspiration such as immediate coughing after the swallow. Pt was then given nectar thick liquids and tolerated this consistency without any overt signs of aspiration at bedside. Pt will return to memory care with Hospice and comfort care. SLP typically recommends regular diet, thin liquids with pt consuming as able, however, coughing is frequent and appears uncomfortable to pt. Continue with current recommended diet of Dys 1 (puree) and nectar thick liquids.    HPI HPI: [age] y.o.femalewith medical history significant of DM, CHF, CKD3, Dementia, Bed bound, resident of SNF, almost total care was brought to the ER due to AMS and questionable facial droop (son believes this is at baseline). CT head negative for acute changes. She was admitted for AKI/hypernatremia.      SLP Plan  Continue with current plan of care     Recommendations  Diet recommendations: Dysphagia 1 (puree);Nectar-thick liquid Liquids provided via: Cup;No straw Medication Administration: Crushed with puree Supervision: Staff to assist with self feeding;Full supervision/cueing for compensatory strategies Compensations: Slow rate;Small sips/bites;Minimize environmental distractions;Lingual sweep for clearance of pocketing Postural Changes and/or Swallow Maneuvers: Seated upright 90 degrees                Oral Care Recommendations: Oral care BID Follow up Recommendations: Skilled Nursing facility (memory care) Plan: Continue with current plan of care       GO                Macarthur CritchleyMeredith  Taeja Debellis, Student SLP 10/28/2016, 1:38 PM

## 2016-10-28 NOTE — Consult Note (Signed)
Consultation Note Date: 10/28/2016   Patient Name: Cassidy Paul  DOB: [birth year]  MRN: [id number]  Age / Sex: 83 y.o., female  PCP: Florentina Jenny, MD Referring Physician: Zannie Cove, MD  Reason for Consultation: Disposition and Establishing goals of care  HPI/Patient Profile: 83 y.o. female  with past medical history of Alzheimer's Dementia progressed to the point that she is full care and bedbound, DM, CHF, and CKD3 who was admitted on 10/25/2016 with AKI with electrolyte abnormalities and acute metabolic encephalopathy. Work-up of both of these issues have been non-revealing, and now attributed to increasingly poor oral intake exacerbated by medication (diuretic, ACEI). She has improved with IV hydration.  Clinical Assessment and Goals of Care: Mrs. Oldenburg is unable to meaningfully participate in goals of care conversations secondary to dementia and likely overlying encephalopathy. I was able to speak with her son, Clide Cliff. He detailed a progressive functional and mental decline over the past few years, with increasingly poor oral intake. I explained the natural course of worsening dementia, which helped him understand her decline and progressive refusal of adequate food and fluids. I also related how her issues at the hospital are attributed to dehydration.  Clide Cliff was very accepting of this information and was able to express that care should be focused around comfort, not life prolonging measures. To that end, he wanted Hospice support to be initiated at time of discharge. He had reached out to them in the past, however she did not qualify for their care at that point. In light of her worsening oral intake, with resultant dehydration, kidney injury, and electrolyte abnormalities, I believe she is appropriate now.   Primary Decision Maker Pt's son, Topanga Alvelo. Home: [phone] Cell: [phone]    SUMMARY OF RECOMMENDATIONS    DNR/DNI; discharge  back to SNF with Hospice  I have notified SW to facilitate this process  Code Status/Advance Care Planning:  DNR  Symptom Management:  No uncontrolled symptoms  Palliative Prophylaxis:   Aspiration, Frequent Pain Assessment, Oral Care and Turn Reposition  Additional Recommendations (Limitations, Scope, Preferences):  Focus on comfort, discharge back to SNF with Hospice  Psycho-social/Spiritual:   Desire for further Chaplaincy support:no  Additional Recommendations: Education on Hospice  Prognosis:   < 6 months  Discharge Planning: Skilled Nursing Facility with Hospice      Primary Diagnoses: Present on Admission: . AKI (acute kidney injury) (HCC) . Alzheimer disease . Encephalopathy acute   I have reviewed the medical record, interviewed the patient and family, and examined the patient. The following aspects are pertinent.  Past Medical History:  Diagnosis Date  . Abnormal gait   . Alzheimer disease   . Arthritis   . Depression   . Diabetes mellitus without complication (HCC)   . Hyperlipemia   . Thyroid disease    Social History   Social History  . Marital status: Widowed    Spouse name: N/A  . Number of children: N/A  . Years of education: N/A   Social History Main Topics  . Smoking status: Never Smoker  . Smokeless tobacco: Never Used  . Alcohol use No  . Drug use: No  . Sexual activity: Not Asked   Other Topics Concern  . None   Social History Narrative  . None   No family history on file. Scheduled Meds: . donepezil  10 mg Oral QHS  . enoxaparin (LOVENOX) injection  30 mg Subcutaneous Q24H  . febuxostat  40 mg Oral Daily  . feeding supplement (  ENSURE ENLIVE)  237 mL Oral BID BM  . insulin aspart  0-9 Units Subcutaneous TID WC  . levothyroxine  50 mcg Oral QAC breakfast  . megestrol  400 mg Oral BID  . mirtazapine  7.5 mg Oral QHS   Continuous Infusions: . sodium chloride 1,000 mL (10/28/16 0454)   PRN Meds:.acetaminophen,  loratadine, RESOURCE THICKENUP CLEAR No Known Allergies   Review of Systems: ROS limited by dementia. She consistently verbally denied pain, discomfort, and nausea.  Physical Exam  Constitutional: Vital signs are normal. She appears lethargic. She appears cachectic. She has a sickly appearance.  HENT:  Head: Normocephalic and atraumatic.  Mouth/Throat: Mucous membranes are dry. Abnormal dentition. No oropharyngeal exudate.  Eyes: EOM are normal.  Neck: Normal range of motion.  Pulmonary/Chest: Effort normal. No respiratory distress.  Abdominal: Soft. There is no tenderness.  Musculoskeletal:  Generalized weakness. Able to move hands and feet.  Neurological: She appears lethargic.  Lethargic. Would awaken to voice. Answers simple questions with repeat verbal cues. Can follow simple commands. Oriented to self only.  Skin: Skin is warm and dry. There is pallor.  Psychiatric: She has a normal mood and affect. Her speech is delayed. She is slowed and withdrawn. Cognition and memory are impaired. She exhibits abnormal recent memory and abnormal remote memory.    Vital Signs: BP (!) 139/53 (BP Location: Left Arm)   Pulse 70   Temp 98 F (36.7 C) (Oral)   Resp 20   Ht 5\' 3"  (1.6 m)   Wt 46.6 kg (102 lb 11.2 oz)   SpO2 97%   BMI 18.19 kg/m  Pain Assessment: 0-10   Pain Score: Asleep   SpO2: SpO2: 97 % O2 Device:SpO2: 97 % O2 Flow Rate: .   IO: Intake/output summary:  Intake/Output Summary (Last 24 hours) at 10/28/16 1243 Last data filed at 10/28/16 1055  Gross per 24 hour  Intake              457 ml  Output                0 ml  Net              457 ml    LBM: Last BM Date: 10/27/16 Baseline Weight: Weight: 46.6 kg (102 lb 11.2 oz) Most recent weight: Weight: 46.6 kg (102 lb 11.2 oz)     Palliative Assessment/Data: PPS 10%   Flowsheet Rows   Flowsheet Row Most Recent Value  Intake Tab  Referral Department  Hospitalist  Unit at Time of Referral  Med/Surg Unit    Palliative Care Primary Diagnosis  Neurology  Date Notified  10/28/16  Palliative Care Type  New Palliative care  Reason for referral  Clarify Goals of Care  Date of Admission  10/25/16  # of days IP prior to Palliative referral  3  Clinical Assessment  Psychosocial & Spiritual Assessment  Palliative Care Outcomes      Time Total: 70 minutes Greater than 50%  of this time was spent counseling and coordinating care related to the above assessment and plan.  Signed by: Murrell ConverseSarah Kylena Mole, NP Palliative Medicine Team Pager # [phone] (M-F 7a-5p) Team Phone # [phone] (Nights/Weekends)

## 2016-10-28 NOTE — NC FL2 (Signed)
[place] MEDICAID FL2 LEVEL OF CARE SCREENING TOOL     IDENTIFICATION  Patient Name: Cassidy Paul Birthdate: February 05, 1922 Sex: female Admission Date (Current Location): 10/25/2016  [facility] [place]   Facility and Address:  [facility], [address], [place], [place] [address]      Provider Number: [id number]  Attending Physician Name and Address:  Zannie CovePreetha Joseph, MD  Relative Name and Phone Number:       Current Level of Care: Hospital Recommended Level of Care: Memory Care Prior Approval Number:    Date Approved/Denied:   PASRR Number:    Discharge Plan: Other (Comment) (Memory care)    Current Diagnoses: Patient Active Problem List   Diagnosis Date Noted  . Goals of care, counseling/discussion   . Palliative care by specialist   . Protein-calorie malnutrition, severe 10/27/2016  . AKI (acute kidney injury) (HCC) 10/25/2016  . CHF (congestive heart failure) (HCC) 10/25/2016  . Encephalopathy acute 10/25/2016  . Diabetes mellitus (HCC) 10/25/2016  . Alzheimer's dementia without behavioral disturbance   . Abnormal gait     Orientation RESPIRATION BLADDER Height & Weight     Self  Normal Incontinent Weight: 46.6 kg (102 lb 11.2 oz) Height:  5\' 3"  (160 cm)  BEHAVIORAL SYMPTOMS/MOOD NEUROLOGICAL BOWEL NUTRITION STATUS      Continent  (Liquids)  AMBULATORY STATUS COMMUNICATION OF NEEDS Skin   Total Care Verbally Normal                       Personal Care Assistance Level of Assistance  Bathing, Feeding, Dressing Bathing Assistance: Maximum assistance Feeding assistance: Maximum assistance Dressing Assistance: Maximum assistance     Functional Limitations Info             SPECIAL CARE FACTORS FREQUENCY                       Contractures      Additional Factors Info  Code Status, Allergies Code Status Info: DNR Allergies Info: NKA           Current Medications (10/28/2016):    Discharge Medications: CONTINUE these medications which have NOT CHANGED   Details  acetaminophen (TYLENOL) 650 MG CR tablet Take 650 mg by mouth every 6 (six) hours as needed for pain.     febuxostat (ULORIC) 40 MG tablet Take 40 mg by mouth daily.    levothyroxine (SYNTHROID, LEVOTHROID) 50 MCG tablet Take 50 mcg by mouth daily before breakfast.     megestrol (MEGACE) 400 MG/10ML suspension Take 400 mg by mouth 2 (two) times daily.    mirtazapine (REMERON) 15 MG tablet Take 7.5 mg by mouth at bedtime.    cholecalciferol (VITAMIN D) 400 UNITS TABS tablet Take 800 Units by mouth daily.    donepezil (ARICEPT) 10 MG tablet Take 10 mg by mouth at bedtime.    protein supplement (RESOURCE BENEPROTEIN) POWD Take 2 scoop by mouth 3 (three) times daily with meals. In 4 ounces of water.         STOP taking these medications     furosemide (LASIX) 20 MG tablet      lisinopril (PRINIVIL,ZESTRIL) 2.5 MG tablet      loratadine (CLARITIN) 10 MG tablet      metFORMIN (GLUCOPHAGE) 500 MG tablet      potassium chloride (K-DUR) 10 MEQ tablet      glipiZIDE (GLUCOTROL) 10 MG tablet  Relevant Imaging Results:  Relevant Lab Results:   Additional Information SSn: [id number]     [facility] to follow patient.  Mearl Latin, LCSWA

## 2016-10-28 NOTE — Discharge Summary (Signed)
Physician Discharge Summary  Cassidy Paul ZOX:[id number] DOB: Jul 12, 1922 DOA: 10/25/2016  PCP: Cassidy Paul, HENRY, MD  Admit date: 10/25/2016 Discharge date: 10/28/2016  Time spent: 35 minutes  Recommendations for Outpatient Follow-up:  1. SNF with Hospice for Comfort care   Discharge Diagnoses:  Principal Problem:   AKI (acute kidney injury) (HCC)   Metabolic encephalopathy   Alzheimer's dementia without behavioral disturbance   CHF (congestive heart failure) (HCC)   Encephalopathy acute   Diabetes mellitus (HCC)   Protein-calorie malnutrition, severe   Goals of care, counseling/discussion   Palliative care by specialist   Discharge Condition: poor  Diet recommendation: Comfort feeds  Filed Weights   10/25/16 1932  Weight: 46.6 kg (102 lb 11.2 oz)    History of present illness:  Cassidy MonacoLaura M Smithis a 83 y.o.femalewith medical history significant of DM, CHF, CKD3, Dementia, Bed bound, resident of SNF, almost total care was brought to the ER due to the above complaints. Per Staff at SNF more sleepy for last 1-2days with decreased PO intake  Hospital Course:  AKI (acute kidney injury) (HCC)\ -likely prerenal from poor Po intake, diuretics/ACE etc -creatinine up to 2.5 from baseline of 1.2 with hypernatremia and hyperkalemia -slow improvement to 1.9 and plateaued now, Renal US without hydronephrosis -ACE and lasix held  Acute metabolic encephalopathy -due to AKI/hyperntremia with baseline Dementia -mild improvement, back to baseline now  -CT headunremarkable, CXR/UA unremarkable -s/p SLP eval, Dysphagia noted, now started on D1 diet with nectar thick liquids -d/w Son, Palliative consulted, s/p Family meeting this afternoon plan for discharge to SNF with Hsopice for comfort care  Alzheimer disease -total care, Bed bound, SNF resident, discussed poor prognosis and code status and son agreed to DNR on admission -Po intake is minimal, discussed poor prognosis with son and  recommended Palliative consult for Goals of care -back to SNF with Hospice for comfort care  H/o CHF (congestive heart failure) (HCC) - holding lasix due to AKI , dehydration -no ECHO in our system  Diabetes mellitus (HCC) -stopped metformin, SSI  Consultations: Palliative medicine  Discharge Exam: Vitals:   10/27/16 2306 10/28/16 0504  BP: (!) 115/52 (!) 139/53  Pulse: 65 70  Resp: 18 20  Temp: 99.9 F (37.7 C) 98 F (36.7 C)    General: Somnolent, no distress Cardiovascular: S1S2/RRR Respiratory: decreased BS at bases  Discharge Instructions   Discharge Instructions    Diet - low sodium heart healthy    Complete by:  As directed    Increase activity slowly    Complete by:  As directed      Current Discharge Medication List    CONTINUE these medications which have NOT CHANGED   Details  acetaminophen (TYLENOL) 650 MG CR tablet Take 650 mg by mouth every 6 (six) hours as needed for pain.     febuxostat (ULORIC) 40 MG tablet Take 40 mg by mouth daily.    levothyroxine (SYNTHROID, LEVOTHROID) 50 MCG tablet Take 50 mcg by mouth daily before breakfast.     megestrol (MEGACE) 400 MG/10ML suspension Take 400 mg by mouth 2 (two) times daily.    mirtazapine (REMERON) 15 MG tablet Take 7.5 mg by mouth at bedtime.    cholecalciferol (VITAMIN D) 400 UNITS TABS tablet Take 800 Units by mouth daily.    donepezil (ARICEPT) 10 MG tablet Take 10 mg by mouth at bedtime.    protein supplement (RESOURCE BENEPROTEIN) POWD Take 2 scoop by mouth 3 (three) times daily with meals. In 4  ounces of water.      STOP taking these medications     furosemide (LASIX) 20 MG tablet      lisinopril (PRINIVIL,ZESTRIL) 2.5 MG tablet      loratadine (CLARITIN) 10 MG tablet      metFORMIN (GLUCOPHAGE) 500 MG tablet      potassium chloride (K-DUR) 10 MEQ tablet      glipiZIDE (GLUCOTROL) 10 MG tablet        No Known Allergies    The results of significant diagnostics from  this hospitalization (including imaging, microbiology, ancillary and laboratory) are listed below for reference.    Significant Diagnostic Studies: Dg Chest 1 View  Result Date: 10/25/2016 CLINICAL DATA:  Altered mental status EXAM: CHEST 1 VIEW COMPARISON:  July 27, 2013 FINDINGS: There is mild atelectatic change in left lower lobe. There is no edema or consolidation. Heart size and pulmonary vascularity are normal. No adenopathy. There is atherosclerotic calcification aorta. There is old healed rib trauma on the left inferiorly. Bones are osteoporotic. IMPRESSION: Mild left lower lobe atelectatic change. No edema or consolidation. Stable cardiac silhouette. There is aortic atherosclerosis. Bones are osteoporotic. Electronically Signed   By: Bretta Bang III M.D.   On: 10/25/2016 18:13   Ct Head Wo Contrast  Result Date: 10/25/2016 CLINICAL DATA:  83 year old female with altered mental status. Initial encounter. EXAM: CT HEAD WITHOUT CONTRAST TECHNIQUE: Contiguous axial images were obtained from the base of the skull through the vertex without intravenous contrast. COMPARISON:  12/18/2015 and earlier. FINDINGS: Brain: Stable cerebral volume. Stable lateral and third ventricular prominence. No midline shift, mass effect, or evidence of intracranial mass lesion. No acute intracranial hemorrhage identified. Patchy bilateral cerebral white matter hypodensity is stable. No cortically based acute infarct identified. No cortical encephalomalacia identified. Vascular: Calcified atherosclerosis at the skull base. No suspicious intracranial vascular hyperdensity. Skull: No acute osseous abnormality identified. Sinuses/Orbits: Visualized paranasal sinuses and mastoids are stable and well pneumatized. Other: No acute orbit or scalp soft tissue findings. IMPRESSION: No acute intracranial abnormality. Stable non contrast CT appearance of the brain. Electronically Signed   By: Odessa Fleming M.D.   On: 10/25/2016 15:26    US Renal  Result Date: 10/27/2016 CLINICAL DATA:  83 y/o  F; acute kidney injury. EXAM: RENAL / URINARY TRACT ULTRASOUND COMPLETE COMPARISON:  None. FINDINGS: Right Kidney: Length: 9.0 cm. No mass or hydronephrosis identified. Increased echogenicity of renal parenchyma. Left Kidney: Length: 9.1 cm. No mass or hydronephrosis identified. Increased echogenicity of renal parenchyma. Bladder: Appears normal for degree of bladder distention. IMPRESSION: Mild increased echogenicity of renal parenchyma consistent with medical renal disease. No mass or hydronephrosis identified. Electronically Signed   By: Mitzi Hansen M.D.   On: 10/27/2016 01:28    Microbiology: Recent Results (from the past 240 hour(s))  MRSA PCR Screening     Status: None   Collection Time: 10/26/16  5:58 AM  Result Value Ref Range Status   MRSA by PCR NEGATIVE NEGATIVE Final    Comment:        The GeneXpert MRSA Assay (FDA approved for NASAL specimens only), is one component of a comprehensive MRSA colonization surveillance program. It is not intended to diagnose MRSA infection nor to guide or monitor treatment for MRSA infections.      Labs: Basic Metabolic Panel:  Recent Labs Lab 10/25/16 1517 10/25/16 1953 10/26/16 0530 10/27/16 0536 10/28/16 0637  NA 147*  --  147* 144 145  K 5.6*  --  5.2* 4.7 4.3  CL 114*  --  116* 118* 118*  CO2 22  --  22 17* 21*  GLUCOSE 169*  --  137* 104* 130*  BUN 57*  --  51* 40* 34*  CREATININE 2.51* 2.34* 2.34* 1.99* 1.91*  CALCIUM 9.9  --  8.9 8.3* 8.6*   Liver Function Tests:  Recent Labs Lab 10/25/16 1517  AST 27  ALT 65*  ALKPHOS 59  BILITOT 0.7  PROT 6.4*  ALBUMIN 3.2*   No results for input(s): LIPASE, AMYLASE in the last 168 hours. No results for input(s): AMMONIA in the last 168 hours. CBC:  Recent Labs Lab 10/25/16 1517 10/25/16 1953 10/26/16 0530 10/27/16 0536 10/28/16 0637  WBC 9.4 8.4 8.2 5.4 5.8  NEUTROABS 7.4  --   --   --   --    HGB 12.9 11.2* 10.5* 10.2* 10.7*  HCT 41.7 35.7* 34.5* 32.9* 33.8*  MCV 84.6 84.8 85.4 85.7 84.7  PLT 180 162 178 153 174   Cardiac Enzymes:  Recent Labs Lab 10/25/16 1517  TROPONINI <0.03   BNP: BNP (last 3 results) No results for input(s): BNP in the last 8760 hours.  ProBNP (last 3 results) No results for input(s): PROBNP in the last 8760 hours.  CBG:  Recent Labs Lab 10/27/16 1203 10/27/16 1735 10/27/16 2314 10/28/16 0733 10/28/16 1217  GLUCAP 125* 243* 116* 118* 187*       SignedZannie Cove MD.  [facility] 10/28/2016, 1:23 PM

## 2016-10-28 NOTE — Progress Notes (Signed)
Patient prepared for DC this evening and has no questions. Transport already set up and report has been called.  Will monitor for any changes.

## 2016-11-11 DEATH — deceased

## 2017-03-10 IMAGING — CT CT HEAD W/O CM
3 of 4 series · 18 of 47 positions shown, 21 images · non-contrast
Comparison: 12/18/2015 and earlier.

CLINICAL DATA: [AGE] female with altered mental status.
Initial encounter.

EXAM:
CT HEAD WITHOUT CONTRAST
TECHNIQUE: Contiguous axial images were obtained from the base of the skull
through the vertex without intravenous contrast.

[Series 201: head w/o, idose (1) · axial · non-contrast · 0.42mm/px · z∈[+1053,+1178]mm · 12 of 31 slices shown, 15 images]
[im 3/31  brain]
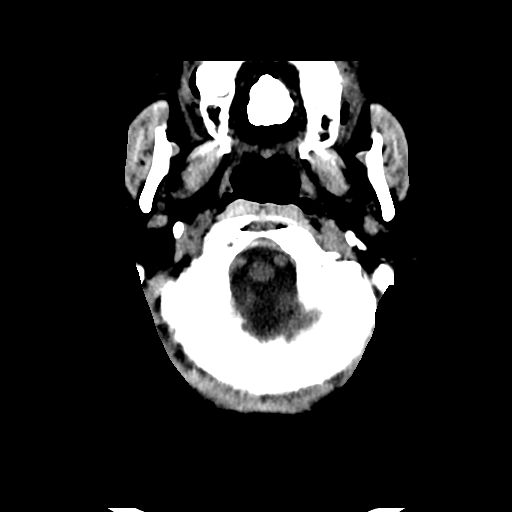
[im 3/31  bone]
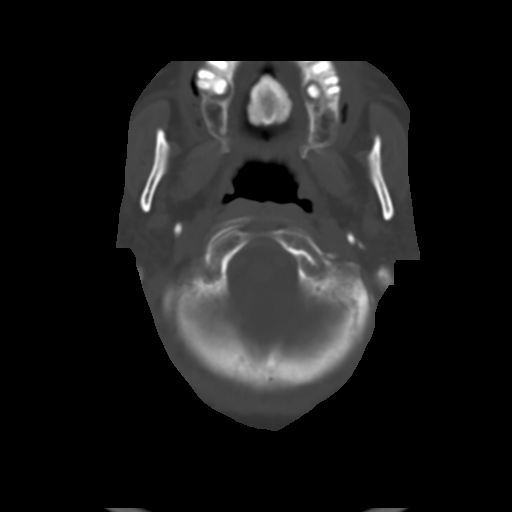
[im 5/31  brain]
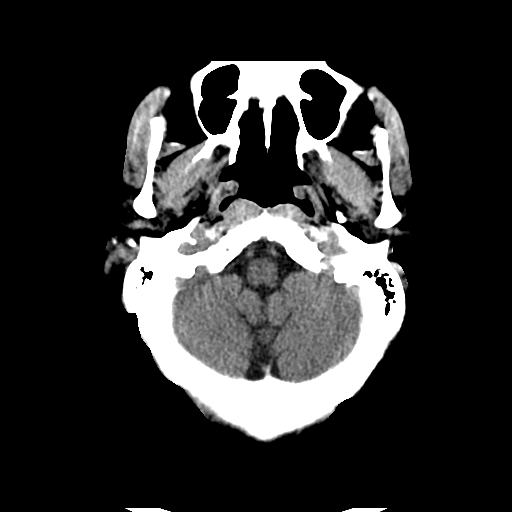
[im 7/31  brain]
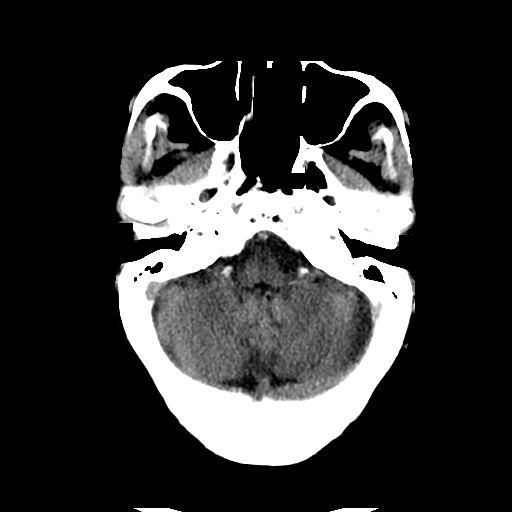
[im 9/31  brain]
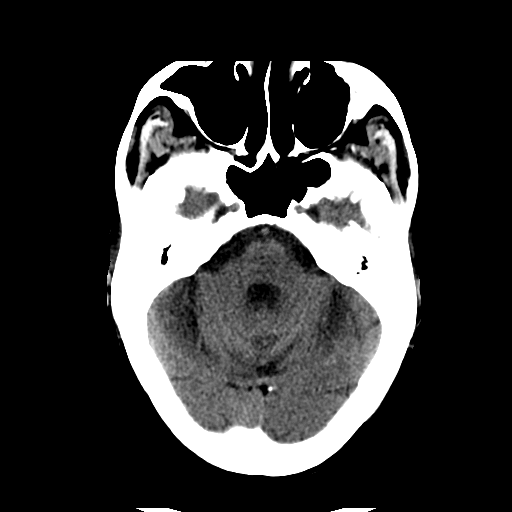
[im 11/31  brain]
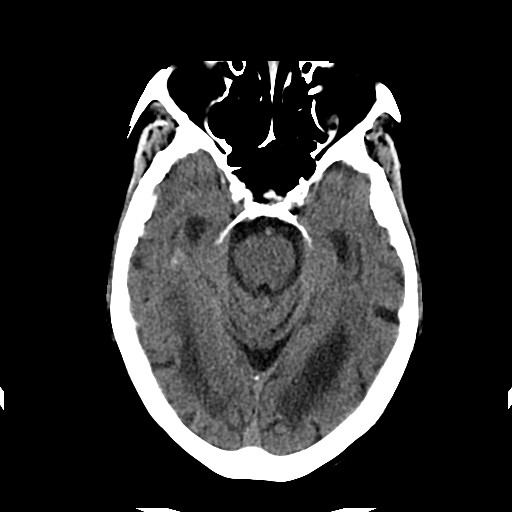
[im 11/31  bone]
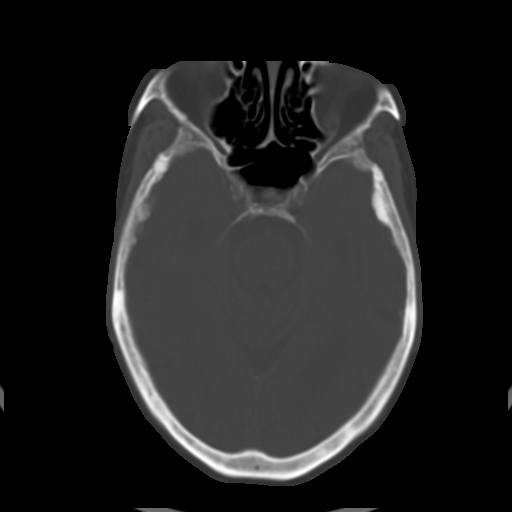
[im 13/31  brain]
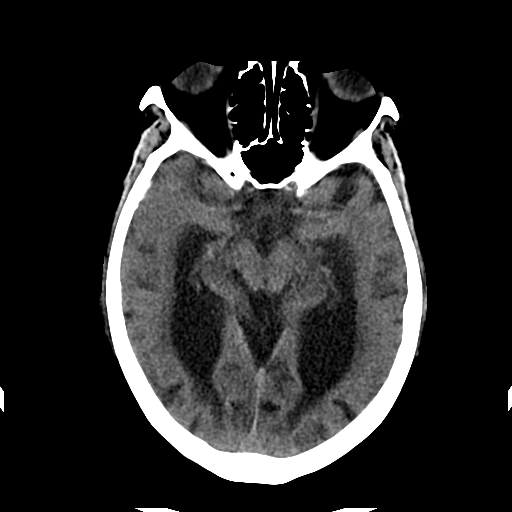
[im 18/31  brain]
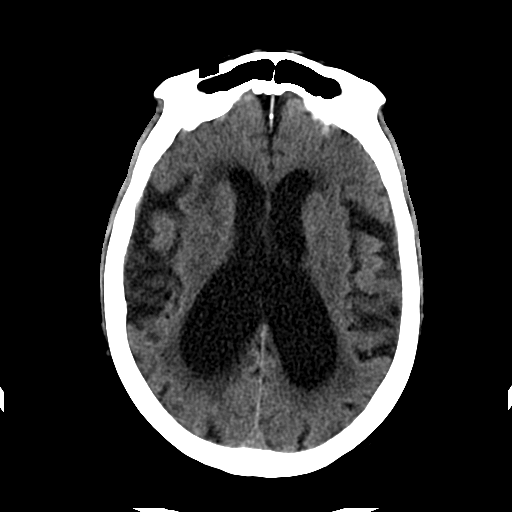
[im 20/31  brain]
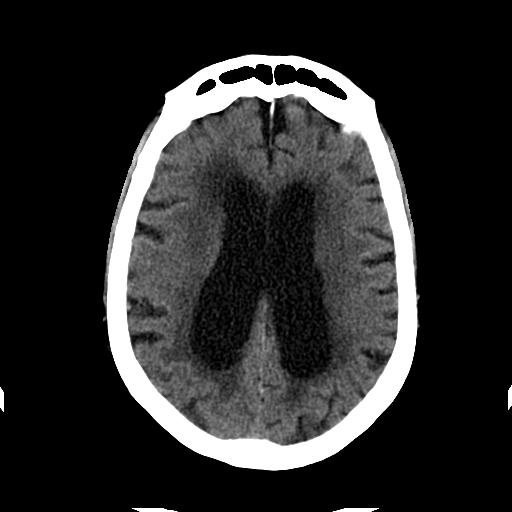
[im 22/31  brain]
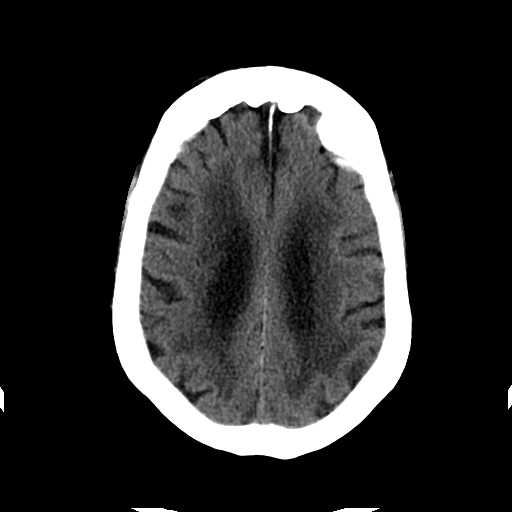
[im 22/31  bone]
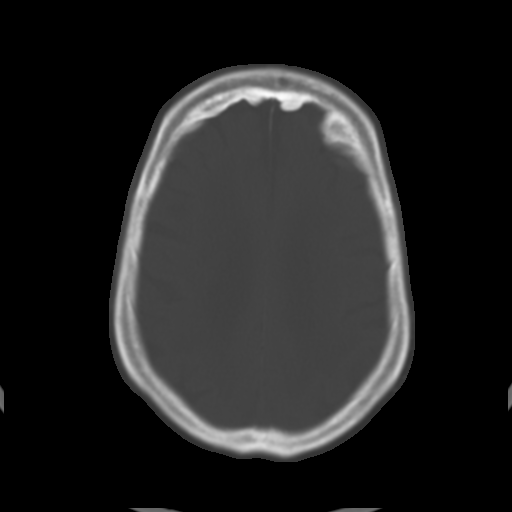
[im 24/31  brain]
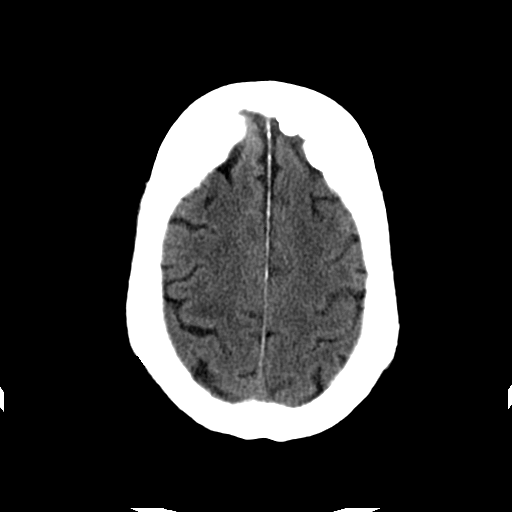
[im 26/31  brain]
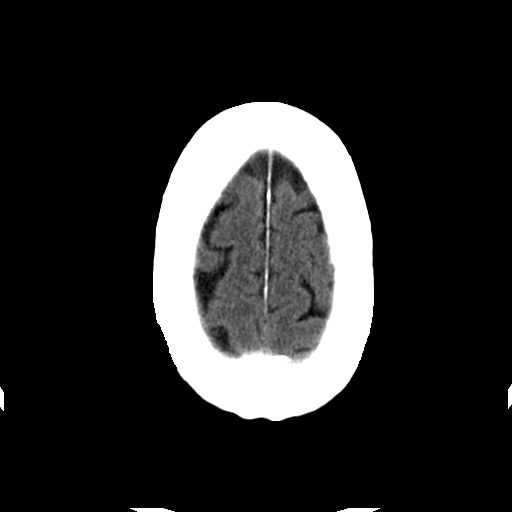
[im 28/31  brain]
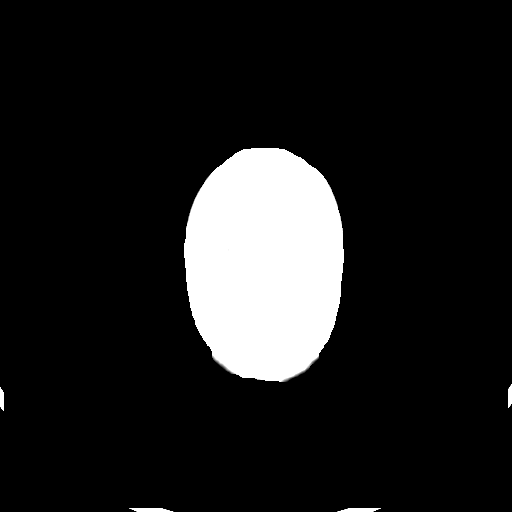

[Series 203: coronal st, idose (1) · coronal · 0.40mm/px · 3 of 72 slices shown]
[im 24/72  brain]
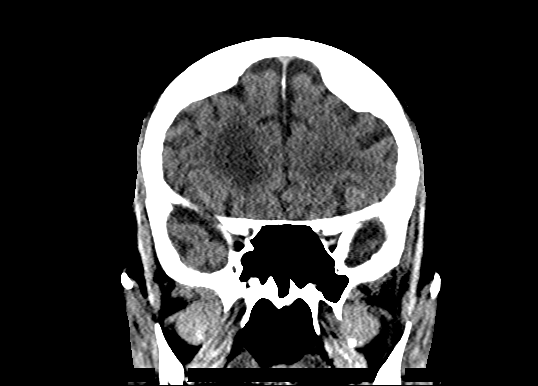
[im 32/72  brain]
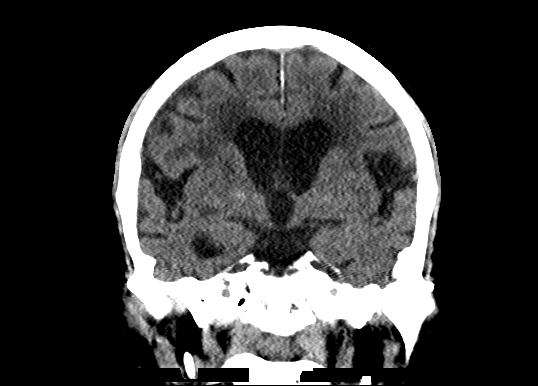
[im 40/72  brain]
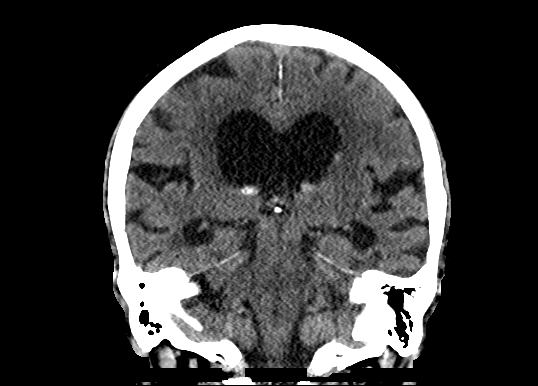

[Series 204: sagittal st, idose (1) · sagittal · 0.40mm/px · 3 of 72 slices shown]
[im 24/72  brain]
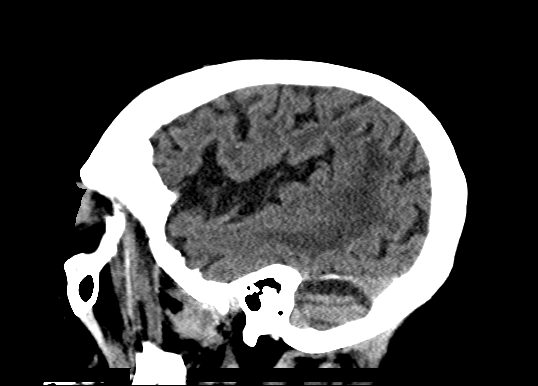
[im 36/72  brain]
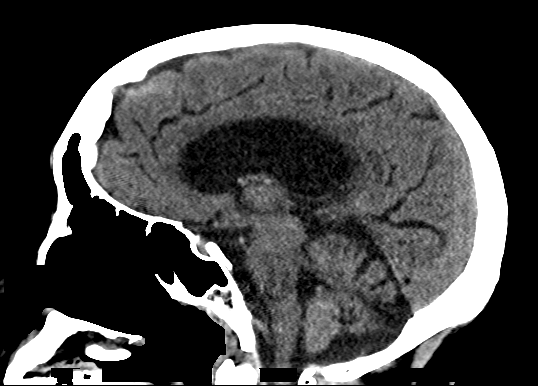
[im 48/72  brain]
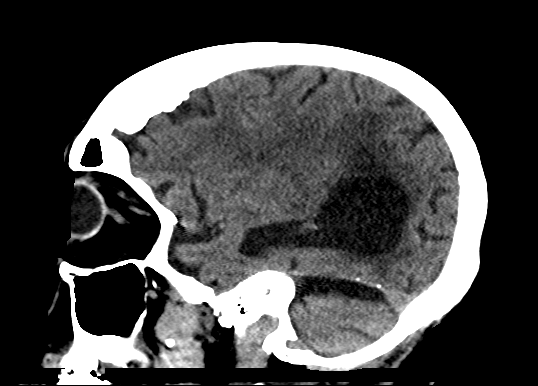

[18 of 47 positions shown; findings below may reference images not displayed]

FINDINGS: Brain: Stable cerebral volume. Stable lateral and third ventricular
prominence. No midline shift, mass effect, or evidence of
intracranial mass lesion. No acute intracranial hemorrhage
identified. Patchy bilateral cerebral white matter hypodensity is
stable. No cortically based acute infarct identified. No cortical
encephalomalacia identified.

Vascular: Calcified atherosclerosis at the skull base. No suspicious
intracranial vascular hyperdensity.

Skull: No acute osseous abnormality identified.

Sinuses/Orbits: Visualized paranasal sinuses and mastoids are stable
and well pneumatized.

Other: No acute orbit or scalp soft tissue findings.
IMPRESSION: No acute intracranial abnormality. Stable non contrast CT appearance
of the brain.
